# Patient Record
Sex: Female | Born: 2000 | Race: White | Hispanic: No | Marital: Single | Smoking: Never smoker
Health system: Southern US, Community
[De-identification: ages and names within clinical notes are randomized; demographics above are authoritative.]

## PROBLEM LIST (undated history)

## (undated) DIAGNOSIS — IMO0002 Reserved for concepts with insufficient information to code with codable children: Secondary | ICD-10-CM

## (undated) DIAGNOSIS — M549 Dorsalgia, unspecified: Secondary | ICD-10-CM

## (undated) DIAGNOSIS — F32A Depression, unspecified: Secondary | ICD-10-CM

## (undated) DIAGNOSIS — F419 Anxiety disorder, unspecified: Secondary | ICD-10-CM

## (undated) DIAGNOSIS — E739 Lactose intolerance, unspecified: Secondary | ICD-10-CM

## (undated) DIAGNOSIS — Z91018 Allergy to other foods: Secondary | ICD-10-CM

## (undated) DIAGNOSIS — J302 Other seasonal allergic rhinitis: Secondary | ICD-10-CM

## (undated) HISTORY — DX: Dorsalgia, unspecified: M54.9

## (undated) HISTORY — DX: Depression, unspecified: F32.A

## (undated) HISTORY — DX: Anxiety disorder, unspecified: F41.9

## (undated) HISTORY — DX: Lactose intolerance, unspecified: E73.9

## (undated) HISTORY — DX: Other seasonal allergic rhinitis: J30.2

## (undated) HISTORY — DX: Allergy to other foods: Z91.018

## (undated) HISTORY — DX: Reserved for concepts with insufficient information to code with codable children: IMO0002

---
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
## 2018-11-02 ENCOUNTER — Ambulatory Visit: Payer: PRIVATE HEALTH INSURANCE | Admitting: Family Medicine

## 2018-11-02 ENCOUNTER — Encounter: Payer: Self-pay | Admitting: Family Medicine

## 2018-11-02 VITALS — BP 102/64 | HR 115 | Temp 99.2°F | Ht 71.5 in | Wt 233.5 lb

## 2018-11-02 DIAGNOSIS — R509 Fever, unspecified: Secondary | ICD-10-CM | POA: Diagnosis not present

## 2018-11-02 DIAGNOSIS — J111 Influenza due to unidentified influenza virus with other respiratory manifestations: Secondary | ICD-10-CM | POA: Diagnosis not present

## 2018-11-02 LAB — POC INFLUENZA A&B (BINAX/QUICKVUE)
INFLUENZA A, POC: NEGATIVE
INFLUENZA B, POC: POSITIVE — AB

## 2018-11-02 MED ORDER — BENZONATATE 200 MG PO CAPS: 200.0000 mg | ORAL_CAPSULE | Freq: Three times a day (TID) | ORAL | 1 refills | Status: DC | PRN

## 2018-11-02 MED ORDER — OSELTAMIVIR PHOSPHATE 75 MG PO CAPS: 75.0000 mg | ORAL_CAPSULE | Freq: Two times a day (BID) | ORAL | 0 refills | Status: DC

## 2018-11-02 MED ORDER — ALBUTEROL SULFATE HFA 108 (90 BASE) MCG/ACT IN AERS: 1.0000 | INHALATION_SPRAY | Freq: Four times a day (QID) | RESPIRATORY_TRACT | 0 refills | Status: DC | PRN

## 2018-11-02 MED ORDER — FLUTICASONE PROPIONATE 50 MCG/ACT NA SUSP: 1.0000 | Freq: Every day | NASAL | 1 refills | Status: DC

## 2018-11-02 NOTE — Progress Notes (Signed)
Sx started about 4-5 days ago.  Started with cough.  Then sneezing, sweating, and more cough.  No sputum.  No aches. Some fevers, up to 101.7.  Fevers started about 2 days ago.  No vomiting, one episode of diarrhea.  No rash.  No ear pain.   Meds, vitals, and allergies reviewed.   ROS: Per HPI unless specifically indicated in ROS section   GEN: nad, alert and oriented HEENT: mucous membranes moist, tm w/o erythema, nasal exam w/o erythema, clear discharge noted,  OP with cobblestoning NECK: supple w/o LA CV: rrr.   PULM: ctab, no inc wob EXT: no edema SKIN: well perfused.    Flu positive, d/w pt at OV.

## 2018-11-02 NOTE — Patient Instructions (Signed)
Out of work for now.  Start tamiflu tonight.   Use tessalon pills for the cough.  If not better, use albuterol inhaler.  Rest and fluids.   Use flonase for nasal congestion.  Take care.  Glad to see you.  Update [place] as needed.

## 2018-11-03 DIAGNOSIS — J111 Influenza due to unidentified influenza virus with other respiratory manifestations: Secondary | ICD-10-CM | POA: Insufficient documentation

## 2018-11-03 NOTE — Assessment & Plan Note (Signed)
Nontoxic, okay for outpatient f/u.  Out of work for now.  Start tamiflu tonight.   Use tessalon pills for the cough.  If not better, use albuterol inhaler.  Rest and fluids.  Use flonase for nasal congestion.  Update [place] as needed.  She agrees.

## 2018-12-30 ENCOUNTER — Other Ambulatory Visit: Payer: Self-pay

## 2018-12-30 ENCOUNTER — Encounter: Payer: Self-pay | Admitting: Internal Medicine

## 2018-12-30 ENCOUNTER — Ambulatory Visit: Payer: PRIVATE HEALTH INSURANCE | Admitting: Internal Medicine

## 2018-12-30 VITALS — BP 108/76 | HR 87 | Temp 98.0°F | Wt 241.0 lb

## 2018-12-30 DIAGNOSIS — J029 Acute pharyngitis, unspecified: Secondary | ICD-10-CM

## 2018-12-30 DIAGNOSIS — Z20828 Contact with and (suspected) exposure to other viral communicable diseases: Secondary | ICD-10-CM

## 2018-12-30 LAB — POCT RAPID STREP A (OFFICE): Rapid Strep A Screen: NEGATIVE

## 2018-12-30 LAB — POC INFLUENZA A&B (BINAX/QUICKVUE)
INFLUENZA A, POC: NEGATIVE
Influenza B, POC: NEGATIVE

## 2018-12-30 MED ORDER — METHYLPREDNISOLONE ACETATE 80 MG/ML IJ SUSP
80.0000 mg | Freq: Once | INTRAMUSCULAR | Status: AC
  Administered 2018-12-30: 80 mg via INTRAMUSCULAR

## 2018-12-30 MED ORDER — OSELTAMIVIR PHOSPHATE 75 MG PO CAPS: 75.0000 mg | ORAL_CAPSULE | Freq: Every day | ORAL | 0 refills | Status: DC

## 2018-12-30 NOTE — Progress Notes (Signed)
HPI  Pt presents to the clinic today with c/o headache, sore throat and cough. This started yesterday. She describes the headache as a generalized ache. She denies difficulty swallowing. The cough is non productive. She denies runny nose, nasal congestion or ear pain. She denies fever, chills or body aches. She has tried Claritin, Flonase and Sudafed with minimal relief. She reports her father tested positive for the flu this week. She has no history of allergies.  Review of Systems      Past Medical History:  Diagnosis Date  . Term infant    NSVD, no complications    Family History  Problem Relation Age of Onset  . Asthma Sister   . Asthma Sister   . Cancer Maternal Grandmother        ?  . Diabetes Maternal Grandfather   . Hypertension Maternal Grandfather   . Heart disease Paternal Grandmother        heart surgery  . Diabetes Paternal Grandmother   . Diabetes Paternal Grandfather   . Heart disease Paternal Grandfather        CAD    Social History   Socioeconomic History  . Marital status: Single    Spouse name: Not on file  . Number of children: Not on file  . Years of education: Not on file  . Highest education level: Not on file  Occupational History  . Not on file  Social Needs  . Financial resource strain: Not on file  . Food insecurity:    Worry: Not on file    Inability: Not on file  . Transportation needs:    Medical: Not on file    Non-medical: Not on file  Tobacco Use  . Smoking status: Never Smoker  . Smokeless tobacco: Never Used  Substance and Sexual Activity  . Alcohol use: No    Alcohol/week: 0.0 standard drinks  . Drug use: No  . Sexual activity: Not on file  Lifestyle  . Physical activity:    Days per week: Not on file    Minutes per session: Not on file  . Stress: Not on file  Relationships  . Social connections:    Talks on phone: Not on file    Gets together: Not on file    Attends religious service: Not on file    Active member  of club or organization: Not on file    Attends meetings of clubs or organizations: Not on file    Relationship status: Not on file  . Intimate partner violence:    Fear of current or ex partner: Not on file    Emotionally abused: Not on file    Physically abused: Not on file    Forced sexual activity: Not on file  Other Topics Concern  . Not on file  Social History Narrative   Mom, dad and sister, dog   Plans on being vet   Watches TV: 4 hours a day   [employer], pool    Allergies  Allergen Reactions  . Amoxicillin-Pot Clavulanate     Vomting. Tolerates amoxicillin     Constitutional: Positive headache. Denies fatigue, fever or abrupt weight changes.  HEENT:  Positive sore throat. Denies eye redness, eye pain, pressure behind the eyes, facial pain, nasal congestion, ear pain, ringing in the ears, wax buildup, runny nose or bloody nose. Respiratory: Positive cough. Denies difficulty breathing or shortness of breath.  Cardiovascular: Denies chest pain, chest tightness, palpitations or swelling in the hands  or feet.   No other specific complaints in a complete review of systems (except as listed in HPI above).  Objective:   BP 108/76   Pulse 87   Temp 98 F (36.7 C) (Oral)   Wt 241 lb (109.3 kg)   SpO2 98%   BMI 33.14 kg/m   Wt Readings from Last 3 Encounters:  11/02/18 233 lb 8 oz (105.9 kg) (>99 %, Z= 2.35)*  12/01/17 221 lb 4 oz (100.4 kg) (99 %, Z= 2.21)*  05/27/17 198 lb (89.8 kg) (97 %, Z= 1.95)*   * Growth percentiles are based on CDC (Girls, 2-20 Years) data.     General: Appears her stated age, obese, in NAD. HEENT: Head: normal shape and size, no sinus tenderness noted;  Ears: Tm's gray and intact, normal light reflex; Nose: mucosa pink and moist, septum midline; Throat/Mouth: + PND. Teeth present, mucosa erythematous and moist, tonsils 3+ ,no exudate noted, no lesions or ulcerations noted.  Neck: Bilateral anterior cervical lymphadenopathy.   Cardiovascular: Normal rate and rhythm. S1,S2 noted.  No murmur, rubs or gallops noted.  Pulmonary/Chest: Normal effort and positive vesicular breath sounds. No respiratory distress. No wheezes, rales or ronchi noted.       Assessment & Plan:  Sore Throat, Exposure to Flu:  RST: negative Rapid flu: negatvie Get some rest and drink plenty of water Do salt water gargles for the sore throat 80 mg Depo IM now RX for Tamiflu for prevention  RTC as needed or if symptoms persist.   Nicki Reaper, NP

## 2018-12-30 NOTE — Patient Instructions (Signed)
Sore Throat  When you have a sore throat, your throat may feel:  · Tender.  · Burning.  · Irritated.  · Scratchy.  · Painful when you swallow.  · Painful when you talk.  Many things can cause a sore throat, such as:  · An infection.  · Allergies.  · Dry air.  · Smoke or pollution.  · Radiation treatment.  · Gastroesophageal reflux disease (GERD).  · A tumor.  A sore throat can be the first sign of another sickness. It can happen with other problems, like:  · Coughing.  · Sneezing.  · Fever.  · Swelling in the neck.  Most sore throats go away without treatment.  Follow these instructions at home:         · Take over-the-counter medicines only as told by your doctor.  ? If your child has a sore throat, do not give your child aspirin.  · Drink enough fluids to keep your pee (urine) pale yellow.  · Rest when you feel you need to.  · To help with pain:  ? Sip warm liquids, such as broth, herbal tea, or warm water.  ? Eat or drink cold or frozen liquids, such as frozen ice pops.  ? Gargle with a salt-water mixture 3-4 times a day or as needed. To make a salt-water mixture, add ½-1 tsp (3-6 g) of salt to 1 cup (237 mL) of warm water. Mix it until you cannot see the salt anymore.  ? Suck on hard candy or throat lozenges.  ? Put a cool-mist humidifier in your bedroom at night.  ? Sit in the bathroom with the door closed for 5-10 minutes while you run hot water in the shower.  · Do not use any products that contain nicotine or tobacco, such as cigarettes, e-cigarettes, and chewing tobacco. If you need help quitting, ask your doctor.  · Wash your hands well and often with soap and water. If soap and water are not available, use hand sanitizer.  Contact a doctor if:  · You have a fever for more than 2-3 days.  · You keep having symptoms for more than 2-3 days.  · Your throat does not get better in 7 days.  · You have a fever and your symptoms suddenly get worse.  · Your child who is 3 months to 3 years old has a temperature of  102.2°F (39°C) or higher.  Get help right away if:  · You have trouble breathing.  · You cannot swallow fluids, soft foods, or your saliva.  · You have swelling in your throat or neck that gets worse.  · You keep feeling sick to your stomach (nauseous).  · You keep throwing up (vomiting).  Summary  · A sore throat is pain, burning, irritation, or scratchiness in the throat. Many things can cause a sore throat.  · Take over-the-counter medicines only as told by your doctor. Do not give your child aspirin.  · Drink plenty of fluids, and rest as needed.  · Contact a doctor if your symptoms get worse or your sore throat does not get better within 7 days.  This information is not intended to replace advice given to you by your health care provider. Make sure you discuss any questions you have with your health care provider.  Document Released: 07/16/2008 Document Revised: 03/09/2018 Document Reviewed: 03/09/2018  Elsevier Interactive Patient Education © 2019 Elsevier Inc.

## 2019-03-18 ENCOUNTER — Encounter: Payer: Self-pay | Admitting: Family Medicine

## 2019-03-18 ENCOUNTER — Ambulatory Visit: Payer: PRIVATE HEALTH INSURANCE | Admitting: Family Medicine

## 2019-03-18 ENCOUNTER — Other Ambulatory Visit: Payer: Self-pay

## 2019-03-18 DIAGNOSIS — L253 Unspecified contact dermatitis due to other chemical products: Secondary | ICD-10-CM | POA: Diagnosis not present

## 2019-03-18 DIAGNOSIS — F411 Generalized anxiety disorder: Secondary | ICD-10-CM

## 2019-03-18 DIAGNOSIS — F320 Major depressive disorder, single episode, mild: Secondary | ICD-10-CM | POA: Diagnosis not present

## 2019-03-18 DIAGNOSIS — F5104 Psychophysiologic insomnia: Secondary | ICD-10-CM | POA: Diagnosis not present

## 2019-03-18 MED ORDER — PREDNISONE 20 MG PO TABS: ORAL_TABLET | ORAL | 0 refills | Status: DC

## 2019-03-18 MED ORDER — TRAZODONE HCL 50 MG PO TABS: 25.0000 mg | ORAL_TABLET | Freq: Every evening | ORAL | 3 refills | Status: DC | PRN

## 2019-03-18 NOTE — Assessment & Plan Note (Signed)
Treat with oral prednisone taper and antihistamines.  No red flags for systemic allergic reaction. No S/S of  infection.

## 2019-03-18 NOTE — Patient Instructions (Signed)
Start trazodone at bedtime for sleep. Start  Pred taper for facial swelling.   How to help anxiety - without medication.   1) Regular Exercise - walking, jogging, cycling, dancing, strength training  2)  Begin a Mindfulness/Meditation practice -- this can take a little as 3 minutes and is helpful for all kinds of mood issues -- You can find resources in books -- Or you can download apps like  ---- Headspace App (which currently has free content called "Weathering the Storm") ---- Calm (which has a few free options)  ---- Insignt Timer ---- Stop, Breathe & Think  # With each of these Apps - you should decline the "start free trial" offer and as you search through the App should be able to access some of their free content. You can also chose to pay for the content if you find one that works well for you.   # Many of them also offer sleep specific content which may help with insomnia  3) Healthy Diet -- Avoid or decrease Caffeine -- Avoid or decrease Alcohol -- Drink plenty of water, have a balanced diet -- Avoid cigarettes and marijuana (as well as other recreational drugs)  4) Consider contacting a professional therapist

## 2019-03-18 NOTE — Progress Notes (Signed)
Chief Complaint  Patient presents with  . Facial Swelling    Eye-after wearing false eyelashes on Wednesday    History of Present Illness: HPI  18 year old female presents with mom with acute onset swelling of face.   She reports new onset redness, itching and swelling... started after gluing on false eyelashes.  Symptoms began several hours after placing the lashes  2 days ago.   She took benadryl, tylenol, cold compress, cleaned off makeup.  No SOB, no lip or tounge swelling. No difficulty breathing. Decreased appetite. No fever. In AMs some white crusties and dried discharge.  Does now have a rash on chin and upper cheeks... stated in last 18 hours. She has gradually had less swelling over.  She has been having trouble with sleeping. In last 2 months. Has tried melatonin. Sleeps 3-8 hours a  Night.  Some increase anxiety and worry about covid and starting school. INcreased stress.  Strong family history  Of depression, both parents and only sister  GAD 7 : Generalized Anxiety Score 03/18/2019  Nervous, Anxious, on Edge 2  Control/stop worrying 1  Worry too much - different things 2  Trouble relaxing 3  Restless 2  Easily annoyed or irritable 3  Afraid - awful might happen 1  Total GAD 7 Score 14  Anxiety Difficulty Somewhat difficult    Depression screen PHQ 2/9 03/18/2019  Decreased Interest 1  Down, Depressed, Hopeless 2  PHQ - 2 Score 3  Altered sleeping 3  Tired, decreased energy 3  Change in appetite 2  Feeling bad or failure about yourself  2  Trouble concentrating 1  Moving slowly or fidgety/restless 1  Suicidal thoughts 0  PHQ-9 Score 15  Difficult doing work/chores Somewhat difficult    COVID 19 screen No recent travel or known exposure to COVID19 The patient denies respiratory symptoms of COVID 19 at this time.  The importance of social distancing was discussed today.   Review of Systems  Constitutional: Negative for chills and fever.  HENT:  Negative for congestion, ear pain, sinus pain and sore throat.   Eyes: Positive for discharge. Negative for blurred vision, double vision, photophobia and redness.  Respiratory: Negative for cough and shortness of breath.   Cardiovascular: Negative for chest pain and leg swelling.  Gastrointestinal: Negative for abdominal pain.  Genitourinary: Negative for dysuria.  Musculoskeletal: Negative for myalgias.  Skin: Positive for itching and rash.  Psychiatric/Behavioral: Positive for depression. Negative for hallucinations, memory loss, substance abuse and suicidal ideas. The patient is nervous/anxious. The patient does not have insomnia.       Past Medical History:  Diagnosis Date  . Term infant    NSVD, no complications    reports that she has never smoked. She has never used smokeless tobacco. She reports that she does not drink alcohol or use drugs.   Current Outpatient Medications:  .  fluticasone (FLONASE) 50 MCG/ACT nasal spray, Place 2 sprays into both nostrils daily., Disp: , Rfl:  .  Levonorgestrel (SKYLA) 13.5 MG IUD, Skyla 14 mcg/24 hrs (3 yrs) 13.5 mg intrauterine device  Take 1 device by intrauterine route., Disp: , Rfl:  .  loratadine (CLARITIN) 10 MG tablet, Take 1 tablet (10 mg total) by mouth daily., Disp: 90 tablet, Rfl: 3   Observations/Objective: Blood pressure 122/84, pulse 78, temperature 98.8 F (37.1 C), temperature source Oral, height 5' 11.5" (1.816 m), weight 246 lb 8 oz (111.8 kg), SpO2 98 %.  Physical Exam Constitutional:  General: She is not in acute distress.    Appearance: Normal appearance. She is well-developed. She is not ill-appearing or toxic-appearing.  HENT:     Head: Normocephalic.     Right Ear: Hearing, tympanic membrane, ear canal and external ear normal. Tympanic membrane is not erythematous, retracted or bulging.     Left Ear: Hearing, tympanic membrane, ear canal and external ear normal. Tympanic membrane is not erythematous, retracted  or bulging.     Nose: No mucosal edema or rhinorrhea.     Right Sinus: No maxillary sinus tenderness or frontal sinus tenderness.     Left Sinus: No maxillary sinus tenderness or frontal sinus tenderness.     Mouth/Throat:     Pharynx: Uvula midline.  Eyes:     General: Lids are normal. Lids are everted, no foreign bodies appreciated.     Conjunctiva/sclera: Conjunctivae normal.     Pupils: Pupils are equal, round, and reactive to light.  Neck:     Musculoskeletal: Normal range of motion and neck supple.     Thyroid: No thyroid mass or thyromegaly.     Vascular: No carotid bruit.     Trachea: Trachea normal.  Cardiovascular:     Rate and Rhythm: Normal rate and regular rhythm.     Pulses: Normal pulses.     Heart sounds: Normal heart sounds, S1 normal and S2 normal. No murmur. No friction rub. No gallop.   Pulmonary:     Effort: Pulmonary effort is normal. No tachypnea or respiratory distress.     Breath sounds: Normal breath sounds. No decreased breath sounds, wheezing, rhonchi or rales.  Abdominal:     General: Bowel sounds are normal.     Palpations: Abdomen is soft.     Tenderness: There is no abdominal tenderness.  Skin:    General: Skin is warm and dry.     Findings: No rash.     Comments: Erythema and swelling around both eyes, no current discharge or conjunctivitis  Neurological:     Mental Status: She is alert.  Psychiatric:        Attention and Perception: Attention normal.        Mood and Affect: Mood is depressed. Mood is not anxious. Affect is tearful.        Speech: Speech normal.        Behavior: Behavior normal. Behavior is cooperative.        Thought Content: Thought content normal. Thought content does not include homicidal or suicidal ideation. Thought content does not include homicidal or suicidal plan.        Cognition and Memory: Cognition normal.        Judgment: Judgment normal.      Assessment and Plan   Contact dermatitis due to chemicals Treat  with oral prednisone taper and antihistamines.  No red flags for systemic allergic reaction. No S/S of  infection.  GAD (generalized anxiety disorder) Discussed options.  Will start with stress reduction, relaxation and sleep treatment.  If not improving in 4 weeks at follow consider medications to treat. Scientist, research (medical)ffered counselor.. pt refused.   Chronic insomnia Reviewed healthy sleep hygeine. Start trazodone for sleep at  night.    Kerby NoraAmy Bedsole, MD

## 2019-03-18 NOTE — Assessment & Plan Note (Signed)
Reviewed healthy sleep hygeine. Start trazodone for sleep at  night.

## 2019-03-18 NOTE — Assessment & Plan Note (Signed)
Discussed options.  Will start with stress reduction, relaxation and sleep treatment.  If not improving in 4 weeks at follow consider medications to treat. Scientist, research (medical).. pt refused.

## 2019-04-09 ENCOUNTER — Encounter: Payer: Self-pay | Admitting: Family Medicine

## 2019-04-09 ENCOUNTER — Ambulatory Visit (INDEPENDENT_AMBULATORY_CARE_PROVIDER_SITE_OTHER): Payer: PRIVATE HEALTH INSURANCE | Admitting: Family Medicine

## 2019-04-09 VITALS — BP 110/83 | HR 74 | Temp 98.2°F | Ht 71.5 in | Wt 245.5 lb

## 2019-04-09 DIAGNOSIS — F5104 Psychophysiologic insomnia: Secondary | ICD-10-CM

## 2019-04-09 DIAGNOSIS — F320 Major depressive disorder, single episode, mild: Secondary | ICD-10-CM

## 2019-04-09 DIAGNOSIS — F411 Generalized anxiety disorder: Secondary | ICD-10-CM

## 2019-04-09 MED ORDER — VENLAFAXINE HCL ER 37.5 MG PO CP24: 37.5000 mg | ORAL_CAPSULE | Freq: Every day | ORAL | 3 refills | Status: DC

## 2019-04-09 NOTE — Assessment & Plan Note (Signed)
She will set up counselor through her Mom's work. Start venlafaxine daily, titrate dose. Close follow up in 2 week given fear and anxiety about school start.

## 2019-04-09 NOTE — Progress Notes (Signed)
VIRTUAL VISIT Due to national recommendations of social distancing due to COVID 19, a virtual visit is felt to be most appropriate for this patient at this time.   I connected with the patient on 04/09/19 at 10:20 AM EDT by virtual telehealth platform and verified that I am speaking with the correct person using two identifiers.   I discussed the limitations, risks, security and privacy concerns of performing an evaluation and management service by  virtual telehealth platform and the availability of in person appointments. I also discussed with the patient that there may be a patient responsible charge related to this service. The patient expressed understanding and agreed to proceed.  Patient location: Home Provider [place] [place] Participants: Kerby NoraAmy Eevie Lapp and Vito BergerKristine Carol   Chief Complaint  Patient presents with  . Follow-up    Mood    History of Present Illness: 18 year old female patient presents for follow up on mood.  GAD and  Chronic insomnia: Started at last OV on trazodone. Recommended stress reduction and relaxation. She has tried to meditate at night.  Trazodone makes her sleep issues worse. Cannot fall asleep for 2 hours.   She is more anxious lately given school is about to start 04/26/2019. She has increased stress with this.  She is more irritable latey. No panic attack.    Dep[facility] screen [facility] 04/09/2019 03/18/2019  Decreased Interest 1 1  Down, Depressed, Hopeless 0 2  PHQ - 2 Score 1 3  Altered sleeping 3 3  Tired, decreased energy 2 3  Change in appetite 2 2  Feeling bad or failure about yourself  1 2  Trouble concentrating 0 1  Moving slowly or fidgety/restless 0 1  Suicidal thoughts 0 0  PHQ-9 Score 9 15  Difficult doing work/chores Somewhat difficult Somewhat difficult   GAD 7 : Generalized Anxiety Score 04/09/2019 03/18/2019  Nervous, Anxious, on Edge 3 2  Control/stop worrying 1 1  Worry too much - different things 3 2   Trouble relaxing 3 3  Restless 1 2  Easily annoyed or irritable 2 3  Afraid - awful might happen 0 1  Total GAD 7 Score 13 14  Anxiety Difficulty Somewhat difficult Somewhat difficult      COVID 19 screen No recent travel or known exposure to COVID19 The patient denies respiratory symptoms of COVID 19 at this time.  The importance of social distancing was discussed today.   Review of Systems  Constitutional: Negative for chills and fever.  HENT: Negative for congestion and ear pain.   Eyes: Negative for pain and redness.  Respiratory: Negative for cough and shortness of breath.   Cardiovascular: Negative for chest pain, palpitations and leg swelling.  Gastrointestinal: Negative for abdominal pain, blood in stool, constipation, diarrhea, nausea and vomiting.  Genitourinary: Negative for dysuria.  Musculoskeletal: Negative for falls and myalgias.  Skin: Negative for rash.  Neurological: Negative for dizziness.  Psychiatric/Behavioral: Positive for depression. Negative for hallucinations, memory loss, substance abuse and suicidal ideas. The patient is nervous/anxious and has insomnia.       Past Medical History:  Diagnosis Date  . Term infant    NSVD, no complications    reports that she has never smoked. She has never used smokeless tobacco. She reports that she does not drink alcohol or use drugs.   Current Outpatient Medications:  .  fluticasone (FLONASE) 50 MCG/ACT nasal spray, Place 2 sprays into both nostrils daily., Disp: , Rfl:  .  Levonorgestrel (  SKYLA) 13.5 MG IUD, Skyla 14 mcg/24 hrs (3 yrs) 13.5 mg intrauterine device  Take 1 device by intrauterine route., Disp: , Rfl:  .  loratadine (CLARITIN) 10 MG tablet, Take 1 tablet (10 mg total) by mouth daily., Disp: 90 tablet, Rfl: 3 .  traZODone (DESYREL) 50 MG tablet, Take 0.5-1 tablets (25-50 mg total) by mouth at bedtime as needed for sleep., Disp: 30 tablet, Rfl: 3   Observations/Objective: Blood pressure 110/83,  pulse 74, temperature 98.2 F (36.8 C), temperature source Oral, height 5' 11.5" (1.816 m), weight 245 lb 8 oz (111.4 kg).  Physical Exam  Physical Exam Constitutional:      General: The patient is not in acute distress. Pulmonary:     Effort: Pulmonary effort is normal. No respiratory distress.  Neurological:     Mental Status: The patient is alert and oriented to person, place, and time.  Psychiatric:        Mood and Affect: Mood depressed, flat affect/tearful affect   Behavior: Behavior normal. Insight moderate Denies SI, HI  Assessment and Plan   Depression, major, single episode, mild ([place]) She will set up counselor through her Mom's work. Start venlafaxine daily, titrate dose. Close follow up in 2 week given fear and anxiety about school start.  Chronic insomnia Trazodone made her have more issues sleeping. Can sue melatonin for sleep.  Start SNRI as planned.   I discussed the assessment and treatment plan with the patient. The patient was provided an opportunity to ask questions and all were answered. The patient agreed with the plan and demonstrated an understanding of the instructions.   The patient was advised to call back or seek an in-person evaluation if the symptoms worsen or if the condition fails to improve as anticipated.     Eliezer Lofts, MD

## 2019-04-09 NOTE — Assessment & Plan Note (Signed)
Trazodone made her have more issues sleeping. Can sue melatonin for sleep.  Start SNRI as planned.

## 2019-04-13 NOTE — Progress Notes (Signed)
Spoke with pt She doesn't have her school schedule yet  She stated she would call back to schedule

## 2019-07-05 ENCOUNTER — Other Ambulatory Visit: Payer: Self-pay | Admitting: *Deleted

## 2019-07-05 MED ORDER — VENLAFAXINE HCL ER 75 MG PO CP24: 75.0000 mg | ORAL_CAPSULE | Freq: Every day | ORAL | 0 refills | Status: DC

## 2019-07-12 ENCOUNTER — Other Ambulatory Visit: Payer: Self-pay

## 2019-07-12 ENCOUNTER — Encounter: Payer: Self-pay | Admitting: Family Medicine

## 2019-07-12 ENCOUNTER — Ambulatory Visit (INDEPENDENT_AMBULATORY_CARE_PROVIDER_SITE_OTHER): Payer: PRIVATE HEALTH INSURANCE | Admitting: Family Medicine

## 2019-07-12 VITALS — BP 123/78 | HR 109 | Temp 98.6°F | Ht 71.5 in

## 2019-07-12 DIAGNOSIS — R05 Cough: Secondary | ICD-10-CM | POA: Diagnosis not present

## 2019-07-12 DIAGNOSIS — R0981 Nasal congestion: Secondary | ICD-10-CM

## 2019-07-12 DIAGNOSIS — Z20822 Contact with and (suspected) exposure to covid-19: Secondary | ICD-10-CM

## 2019-07-12 DIAGNOSIS — R059 Cough, unspecified: Secondary | ICD-10-CM

## 2019-07-12 DIAGNOSIS — G44209 Tension-type headache, unspecified, not intractable: Secondary | ICD-10-CM | POA: Diagnosis not present

## 2019-07-12 NOTE — Progress Notes (Signed)
     Kristl Morioka T. Yancy Knoble, MD [facility] [place], [address] Phone: [phone]  FAX: [phone]  Misty [name] - 18 y.o. female  MRN [id number]  Date of Birth: [birth year]  Visit Date: 07/12/2019  PCP: Jinny Sanders, MD  Referred by: Jinny Sanders, MD Chief Complaint  Patient presents with  . Nasal Congestion  . Cough    clear phelgm  . Headache   Virtual Visit via Video Note:  I connected with  Misty [name] on 07/12/2019 11:00 AM EDT by a video enabled telemedicine application and verified that I am speaking with the correct person using two identifiers.   Location patient: home computer, tablet, or smartphone Location provider: work or home office Consent: Verbal consent directly obtained from [employer]. Persons participating in the virtual visit: patient, provider  I discussed the limitations of evaluation and management by telemedicine and the availability of in person appointments. The patient expressed understanding and agreed to proceed.  History of Present Illness:  Well-known patient, she was seen today.  She is having some nasal congestion, runny nose, and some cough with clear phlegm.  She also has a headache, but she does not have any fever.  She denies nausea, vomiting, diarrhea.  No new rashes.  No new neurological symptoms.  Review of Systems as above: See pertinent positives and pertinent negatives per HPI No acute distress verbally  Past Medical History, Surgical History, Social History, Family History, Problem List, Medications, and Allergies have been reviewed and updated if relevant.   Observations/Objective/Exam:  An attempt was made to discern vital signs over the phone and per patient if applicable and possible.   General:    Alert, Oriented, appears well and in no acute distress HEENT:     Atraumatic, conjunctiva clear, no obvious abnormalities on  inspection of external nose and ears.  Neck:    Normal movements of the head and neck Pulmonary:     On inspection no signs of respiratory distress, breathing rate appears normal, no obvious gross SOB, gasping or wheezing Cardiovascular:    No obvious cyanosis Musculoskeletal:    Moves all visible extremities without noticeable abnormality Psych / Neurological:     Pleasant and cooperative, no obvious depression or anxiety, speech and thought processing grossly intact  Assessment and Plan:    ICD-10-CM   1. Cough  R05   2. Acute non intractable tension-type headache  G44.209   3. Nasal congestion  R09.81    Likely viral syndrome, but given constellation of symptoms and COVID-19 outbreak, I will going to send her for COVID-19 testing and keep her quarantined out of school until this returns.  I discussed the assessment and treatment plan with the patient. The patient was provided an opportunity to ask questions and all were answered. The patient agreed with the plan and demonstrated an understanding of the instructions.   The patient was advised to call back or seek an in-person evaluation if the symptoms worsen or if the condition fails to improve as anticipated.  Follow-up: prn unless noted otherwise below No follow-ups on file.  No orders of the defined types were placed in this encounter.  No orders of the defined types were placed in this encounter.   Signed,  Maud Deed. Westlyn Glaza, MD

## 2019-07-14 ENCOUNTER — Telehealth: Payer: Self-pay | Admitting: General Practice

## 2019-07-14 LAB — NOVEL CORONAVIRUS, NAA: SARS-CoV-2, NAA: NOT DETECTED

## 2019-07-14 NOTE — Telephone Encounter (Signed)
Negative COVID results given. Patient results "NOT Detected." Caller expressed understanding. ° °

## 2020-01-27 ENCOUNTER — Encounter: Payer: Self-pay | Admitting: Family Medicine

## 2020-01-28 ENCOUNTER — Ambulatory Visit (INDEPENDENT_AMBULATORY_CARE_PROVIDER_SITE_OTHER)
Admission: RE | Admit: 2020-01-28 | Discharge: 2020-01-28 | Disposition: A | Discharge status: Home / Self Care | Payer: PRIVATE HEALTH INSURANCE | Source: Ambulatory Visit | Attending: Family Medicine | Admitting: Family Medicine

## 2020-01-28 ENCOUNTER — Encounter: Payer: Self-pay | Admitting: Family Medicine

## 2020-01-28 ENCOUNTER — Other Ambulatory Visit: Payer: Self-pay

## 2020-01-28 ENCOUNTER — Ambulatory Visit: Payer: PRIVATE HEALTH INSURANCE | Admitting: Family Medicine

## 2020-01-28 VITALS — BP 120/78 | HR 95 | Temp 97.9°F | Ht 71.5 in

## 2020-01-28 DIAGNOSIS — S99912A Unspecified injury of left ankle, initial encounter: Secondary | ICD-10-CM

## 2020-01-28 DIAGNOSIS — S99911A Unspecified injury of right ankle, initial encounter: Secondary | ICD-10-CM | POA: Insufficient documentation

## 2020-01-28 NOTE — Telephone Encounter (Signed)
Some left v/m earlier but since then appt already scheduled today at 2:45 with Dr Reece Agar.

## 2020-01-28 NOTE — Progress Notes (Addendum)
This visit was conducted in person.  BP 120/78 (BP Location: Right Arm, Patient Position: Sitting, Cuff Size: Large)   Pulse 95   Temp 97.9 F (36.6 C) (Temporal)   Ht 5' 11.5" (1.816 m)   SpO2 98%   BMI 33.76 kg/m    CC: R ankle injury Subjective:    Patient ID: Misty [name], female    DOB: 2001/07/25, 19 y.o.   MRN: [id number]  HPI: Misty [name] is a 19 y.o. female presenting on 01/28/2020 for Ankle Injury (Pt tripped and fell, last night, injuring right ankle. )   DOI: 01/27/2020 Tripped and fell last night while feeding lettuce to a rabbit, lost her balance. Twisted ankle, felt a pop. Pain lateral ankle and anterior ankle. Very swollen last night, keeping it elevated, iced, compression. Walking with crutches. Trouble bearing weight since then. Took tylenol 500-1000mg  last night with benefit.   Broke this ankle across growth plate at age 79WE.  LMP 1 yr ago - has [place] IUD     Relevant past medical, surgical, family and social history reviewed and updated as indicated. Interim medical history since our last visit reviewed. Allergies and medications reviewed and updated. Outpatient Medications Prior to Visit  Medication Sig Dispense Refill  . fluticasone (FLONASE) 50 MCG/ACT nasal spray Place 2 sprays into both nostrils daily.    . Levonorgestrel (SKYLA) 13.5 MG IUD Skyla 14 mcg/24 hrs (3 yrs) 13.5 mg intrauterine device  Take 1 device by intrauterine route.    . loratadine (CLARITIN) 10 MG tablet Take 1 tablet (10 mg total) by mouth daily. 90 tablet 3  . traZODone (DESYREL) 50 MG tablet Take 0.5-1 tablets (25-50 mg total) by mouth at bedtime as needed for sleep. 30 tablet 3  . venlafaxine XR (EFFEXOR XR) 75 MG 24 hr capsule Take 1 capsule (75 mg total) by mouth daily with breakfast. 90 capsule 0   No facility-administered medications prior to visit.     Per HPI unless specifically indicated in ROS section below Review of Systems Objective:    BP 120/78 (BP Location:  Right Arm, Patient Position: Sitting, Cuff Size: Large)   Pulse 95   Temp 97.9 F (36.6 C) (Temporal)   Ht 5' 11.5" (1.816 m)   SpO2 98%   BMI 33.76 kg/m   Wt Readings from Last 3 Encounters:  04/09/19 245 lb 8 oz (111.4 kg)  03/18/19 246 lb 8 oz (111.8 kg) (>99 %, Z= 2.49)*  12/30/18 241 lb (109.3 kg) (>99 %, Z= 2.43)*   * Growth percentiles are based on CDC (Girls, 2-20 Years) data.    Physical Exam Vitals and nursing note reviewed.  Constitutional:      Appearance: Normal appearance. She is not ill-appearing.     Comments: In tears with ankle exam due to pain  Musculoskeletal:        General: Swelling, tenderness and signs of injury present.     Right lower leg: No edema.     Left lower leg: No edema.     Comments:  2+ DP on right L ankle/foot WNL R ankle/foot: Tender to percussion medial and lateral malleolus, tender to palpation of deltoid ankle ligament, mild discomfort along lateral ankle ligament, more pain with testing laxity laterally No ligament laxity with testing medial or lateral ankle ligaments No pain at base of 5th MT No pain at navicular Neg calcaneal squeeze test  Skin:    General: Skin is warm and dry.     Findings: No  bruising or rash.  Neurological:     General: No focal deficit present.     Mental Status: She is alert.       Assessment & Plan:  This visit occurred during the SARS-CoV-2 public health emergency.  Safety protocols were in place, including screening questions prior to the visit, additional usage of staff PPE, and extensive cleaning of exam room while observing appropriate contact time as indicated for disinfecting solutions.   Problem List Items Addressed This Visit    Right ankle injury, initial encounter - Primary    Check xray given tender to malleoli - no obvious fracture noted.  Anticipate bad ankle sprain - placed in ASO ankle brace, rec rest, elevation, ice, NSAIDs.  RTC 2 wk injury check, sooner if worsening.  Exercises for  ankle sprain rehab provided from [facility] pt advisor.       Relevant Orders   DG Ankle Complete Right       No orders of the defined types were placed in this encounter.  Orders Placed This Encounter  Procedures  . DG Ankle Complete Right    Standing Status:   Future    Number of Occurrences:   1    Standing Expiration Date:   03/29/2021    Order Specific Question:   Reason for Exam (SYMPTOM  OR DIAGNOSIS REQUIRED)    Answer:   R ankle injury after fall last night, h/o fracture    Order Specific Question:   Is patient pregnant?    Answer:   No    Order Specific Question:   Preferred imaging location?    Answer:   [facility]    Order Specific Question:   Radiology Contrast Protocol - do NOT remove file path    Answer:   [email or web address]    Patient Instructions  Xrays don't show obvious fracture - will await radiologist read.  I think you had bad ankle sprain.  ASO brace - use for the next 4 weeks.  Do exercises provided today for ankle sprain, use pain as your guide.  Continue crutches for the next 2 weeks.  Use ibuprofen 400-600mg  twice daily with meals for inflammation. May also use tylenol 500-1000mg  twice daily.  Return in 2 weeks or sooner if not improving as expected over the next several weeks.    Follow up plan: Return in about 2 weeks (around 02/11/2020).  Ria Bush, MD

## 2020-01-28 NOTE — Patient Instructions (Addendum)
Xrays don't show obvious fracture - will await radiologist read.  I think you had bad ankle sprain.  ASO brace - use for the next 4 weeks.  Do exercises provided today for ankle sprain, use pain as your guide.  Continue crutches for the next 2 weeks.  Use ibuprofen 400-600mg  twice daily with meals for inflammation. May also use tylenol 500-1000mg  twice daily.  Return in 2 weeks or sooner if not improving as expected over the next several weeks.

## 2020-01-28 NOTE — Assessment & Plan Note (Addendum)
Check xray given tender to malleoli - no obvious fracture noted.  Anticipate bad ankle sprain - placed in ASO ankle brace, rec rest, elevation, ice, NSAIDs.  RTC 2 wk injury check, sooner if worsening.  Exercises for ankle sprain rehab provided from [facility] pt advisor.

## 2020-02-11 ENCOUNTER — Ambulatory Visit: Payer: PRIVATE HEALTH INSURANCE | Admitting: Family Medicine

## 2020-02-11 ENCOUNTER — Encounter: Payer: Self-pay | Admitting: Family Medicine

## 2020-02-11 ENCOUNTER — Other Ambulatory Visit: Payer: Self-pay

## 2020-02-11 VITALS — BP 120/86 | HR 99 | Temp 98.2°F | Ht 71.5 in | Wt 258.2 lb

## 2020-02-11 DIAGNOSIS — S99911A Unspecified injury of right ankle, initial encounter: Secondary | ICD-10-CM

## 2020-02-11 NOTE — Patient Instructions (Signed)
How to Heal a Sprained Ankle: 5-Step Rehab Program ...[email or web address] > training > recovery Continue brace on ankle for likely 2 more weeks.. continue progressing through rehab.

## 2020-02-11 NOTE — Progress Notes (Signed)
Chief Complaint  Patient presents with  . Follow-up    Right Ankle Injury    History of Present Illness: HPI  19 year old female patient presents for follow up right ankle   PAST HPI copied from 01/28/2020 OV with Dr. Reece Agar: DOI: 01/27/2020 Tripped and fell last night while feeding lettuce to a rabbit, lost her balance. Twisted ankle, felt a pop. Pain lateral ankle and anterior ankle. Very swollen last night, keeping it elevated, iced, compression. Walking with crutches. Trouble bearing weight since then. Took tylenol 500-1000mg  last night with benefit.  Broke this ankle across growth plate at age 79KG.   X-ray: IMPRESSION: Soft tissue swelling.  No other abnormalities. Dx ankle sprain: treated with ASO ankle brace, rest, elevation, ice, NSAIDs  She is here today for 2 week follow up. Less swelling. She has noted improvement in ankle pain. Fels much better with brace in place.  At end of day it does hurt. She has been doing home PT 1-2 times daily.  This visit occurred during the SARS-CoV-2 public health emergency.  Safety protocols were in place, including screening questions prior to the visit, additional usage of staff PPE, and extensive cleaning of exam room while observing appropriate contact time as indicated for disinfecting solutions.   COVID 19 screen:  No recent travel or known exposure to COVID19 The patient denies respiratory symptoms of COVID 19 at this time. The importance of social distancing was discussed today.     Review of Systems  Constitutional: Negative for chills and fever.  HENT: Negative for congestion and ear pain.   Eyes: Negative for pain and redness.  Respiratory: Negative for cough and shortness of breath.   Cardiovascular: Negative for chest pain, palpitations and leg swelling.  Gastrointestinal: Negative for abdominal pain, blood in stool, constipation, diarrhea, nausea and vomiting.  Genitourinary: Negative for dysuria.  Musculoskeletal: Negative  for falls and myalgias.  Skin: Negative for rash.  Neurological: Negative for dizziness.  Psychiatric/Behavioral: Negative for depression. The patient is not nervous/anxious.       Past Medical History:  Diagnosis Date  . Term infant    NSVD, no complications    reports that she has never smoked. She has never used smokeless tobacco. She reports that she does not drink alcohol or use drugs.   Current Outpatient Medications:  .  fluticasone (FLONASE) 50 MCG/ACT nasal spray, Place 2 sprays into both nostrils daily., Disp: , Rfl:  .  Levonorgestrel (SKYLA) 13.5 MG IUD, Skyla 14 mcg/24 hrs (3 yrs) 13.5 mg intrauterine device  Take 1 device by intrauterine route., Disp: , Rfl:  .  loratadine (CLARITIN) 10 MG tablet, Take 1 tablet (10 mg total) by mouth daily., Disp: 90 tablet, Rfl: 3 .  traZODone (DESYREL) 50 MG tablet, Take 0.5-1 tablets (25-50 mg total) by mouth at bedtime as needed for sleep., Disp: 30 tablet, Rfl: 3 .  venlafaxine XR (EFFEXOR XR) 75 MG 24 hr capsule, Take 1 capsule (75 mg total) by mouth daily with breakfast., Disp: 90 capsule, Rfl: 0   Observations/Objective: Blood pressure 120/86, pulse 99, temperature 98.2 F (36.8 C), temperature source Temporal, height 5' 11.5" (1.816 m), weight 258 lb 4 oz (117.1 kg), SpO2 95 %.  Physical Exam Constitutional:      General: She is not in acute distress.    Appearance: Normal appearance. She is well-developed. She is not ill-appearing or toxic-appearing.  HENT:     Head: Normocephalic.     Right Ear: Hearing, tympanic  membrane, ear canal and external ear normal. Tympanic membrane is not erythematous, retracted or bulging.     Left Ear: Hearing, tympanic membrane, ear canal and external ear normal. Tympanic membrane is not erythematous, retracted or bulging.     Nose: No mucosal edema or rhinorrhea.     Right Sinus: No maxillary sinus tenderness or frontal sinus tenderness.     Left Sinus: No maxillary sinus tenderness or frontal  sinus tenderness.     Mouth/Throat:     Pharynx: Uvula midline.  Eyes:     General: Lids are normal. Lids are everted, no foreign bodies appreciated.     Conjunctiva/sclera: Conjunctivae normal.     Pupils: Pupils are equal, round, and reactive to light.  Neck:     Thyroid: No thyroid mass or thyromegaly.     Vascular: No carotid bruit.     Trachea: Trachea normal.  Cardiovascular:     Rate and Rhythm: Normal rate and regular rhythm.     Pulses: Normal pulses.     Heart sounds: Normal heart sounds, S1 normal and S2 normal. No murmur. No friction rub. No gallop.   Pulmonary:     Effort: Pulmonary effort is normal. No tachypnea or respiratory distress.     Breath sounds: Normal breath sounds. No decreased breath sounds, wheezing, rhonchi or rales.  Abdominal:     General: Bowel sounds are normal.     Palpations: Abdomen is soft.     Tenderness: There is no abdominal tenderness.  Musculoskeletal:     Cervical back: Normal range of motion and neck supple.       Feet:  Feet:     Comments:  Full ROM, nml sensation and strength  diffuse nonpoitting edema of right ankle compared to left  ttp over anterior lateral malleolus.. no other areas of ttp. Skin:    General: Skin is warm and dry.     Findings: No rash.  Neurological:     Mental Status: She is alert.  Psychiatric:        Mood and Affect: Mood is not anxious or depressed.        Speech: Speech normal.        Behavior: Behavior normal. Behavior is cooperative.        Thought Content: Thought content normal.        Judgment: Judgment normal.      Assessment and Plan Right ankle injury, initial encounter Move stepwise thorough rehab.. proceed to strengthening regimen.  Continue brace when up on feet.  Elevated and NSAIDs prn.    Eliezer Lofts, MD

## 2020-02-11 NOTE — Assessment & Plan Note (Signed)
Move stepwise thorough rehab.. proceed to strengthening regimen.  Continue brace when up on feet.  Elevated and NSAIDs prn.

## 2020-09-12 ENCOUNTER — Encounter: Payer: Self-pay | Admitting: Family Medicine

## 2020-09-12 ENCOUNTER — Ambulatory Visit: Payer: PRIVATE HEALTH INSURANCE | Admitting: Family Medicine

## 2020-09-12 ENCOUNTER — Other Ambulatory Visit: Payer: Self-pay

## 2020-09-12 VITALS — BP 102/60 | HR 100 | Temp 97.8°F | Ht 71.5 in | Wt 257.8 lb

## 2020-09-12 DIAGNOSIS — J189 Pneumonia, unspecified organism: Secondary | ICD-10-CM | POA: Diagnosis not present

## 2020-09-12 MED ORDER — AZITHROMYCIN 250 MG PO TABS: ORAL_TABLET | ORAL | 0 refills | Status: AC

## 2020-09-12 NOTE — Progress Notes (Signed)
Kaiya Boatman T. Mervin Ramires, MD, CAQ Sports Medicine  Primary Care and Sports Medicine [facility] [address] [place], [address]  Phone: [phone]  FAX: [phone]  Misty [name] - 19 y.o. female  MRN [id number]  Date of Birth: 2000-12-04  Date: 09/12/2020  PCP: Excell Seltzer, MD  Referral: Excell Seltzer, MD  Chief Complaint  Patient presents with  . Cough    Negative Covidn test a few weeks ago  . Nasal Congestion    This visit occurred during the SARS-CoV-2 public health emergency.  Safety protocols were in place, including screening questions prior to the visit, additional usage of staff PPE, and extensive cleaning of exam room while observing appropriate contact time as indicated for disinfecting solutions.   Subjective:   Misty [name] is a 19 y.o. very pleasant female patient with Body mass index is 35.45 kg/m. who presents with the following:  She presents after being sick for several weeks, and she has had multiple negative Covid test.  She is congested and coughing daily at this point, she does continue to have some congestion in the upper airways as well.  She is coughing at night, and over-the-counter remedies are not really helping all that much.  Father did have COVID-19 approximately 1 month ago.  She has a some earache, and there are some muffled sensations on her sound.  Throughout this time she has not had any fever.  She is globally a pretty healthy young lady.  Stuffy congestion:  Review of Systems is noted in the HPI, as appropriate  Objective:   BP 102/60   Pulse 100   Temp 97.8 F (36.6 C) (Temporal)   Ht 5' 11.5" (1.816 m)   Wt 257 lb 12 oz (116.9 kg)   SpO2 98%   BMI 35.45 kg/m   GEN: No acute distress; alert,appropriate. PULM: Breathing comfortably in no respiratory distress PSYCH: Normally interactive.  CV: RRR, no m/g/r  PULM: Normal respiratory rate, no accessory muscle use. No wheezes,  crackles.  There are some scattered rhonchi bilateral bases  Laboratory and Imaging Data:  Assessment and Plan:     ICD-10-CM   1. Atypical pneumonia  J18.9    With greater than 3 weeks duration and multiple negative Covid test, the highest causes on the differential would be atypical pneumonia.  Pertussis could certainly show the same thing.  Treat with Z-Pak, and additionally use continued over-the-counter cough and cold remedies.  Meds ordered this encounter  Medications  . azithromycin (ZITHROMAX) 250 MG tablet    Sig: Take 2 tablets (500 mg total) by mouth daily for 1 day, THEN 1 tablet (250 mg total) daily for 4 days.    Dispense:  6 tablet    Refill:  0   Medications Discontinued During This Encounter  Medication Reason  . loratadine (CLARITIN) 10 MG tablet Change in therapy   No orders of the defined types were placed in this encounter.   Follow-up: No follow-ups on file.  Signed,  Elpidio Galea. Rivka Baune, MD   Outpatient Encounter Medications as of 09/12/2020  Medication Sig  . cetirizine (ZYRTEC) 10 MG tablet Take 10 mg by mouth daily.  . fluticasone (FLONASE) 50 MCG/ACT nasal spray Place 2 sprays into both nostrils daily.  . Levonorgestrel (SKYLA) 13.5 MG IUD Skyla 14 mcg/24 hrs (3 yrs) 13.5 mg intrauterine device  Take 1 device by intrauterine route.  . traZODone (DESYREL) 50 MG tablet Take 0.5-1 tablets (25-50  mg total) by mouth at bedtime as needed for sleep.  Marland Kitchen azithromycin (ZITHROMAX) 250 MG tablet Take 2 tablets (500 mg total) by mouth daily for 1 day, THEN 1 tablet (250 mg total) daily for 4 days.  Marland Kitchen venlafaxine XR (EFFEXOR XR) 75 MG 24 hr capsule Take 1 capsule (75 mg total) by mouth daily with breakfast. (Patient not taking: Reported on 09/12/2020)  . [DISCONTINUED] loratadine (CLARITIN) 10 MG tablet Take 1 tablet (10 mg total) by mouth daily.   No facility-administered encounter medications on file as of 09/12/2020.

## 2020-10-03 LAB — OB RESULTS CONSOLE GC/CHLAMYDIA: Chlamydia: NEGATIVE

## 2020-10-03 LAB — HM PAP SMEAR: HM Pap smear: NEGATIVE

## 2020-10-23 DIAGNOSIS — R87612 Low grade squamous intraepithelial lesion on cytologic smear of cervix (LGSIL): Secondary | ICD-10-CM

## 2020-10-23 HISTORY — DX: Low grade squamous intraepithelial lesion on cytologic smear of cervix (LGSIL): R87.612

## 2021-02-27 ENCOUNTER — Ambulatory Visit: Payer: PRIVATE HEALTH INSURANCE | Admitting: Family Medicine

## 2021-02-27 ENCOUNTER — Other Ambulatory Visit: Payer: Self-pay

## 2021-02-27 ENCOUNTER — Encounter: Payer: Self-pay | Admitting: Family Medicine

## 2021-02-27 VITALS — BP 94/70 | HR 76 | Temp 98.2°F | Ht 71.5 in | Wt 255.0 lb

## 2021-02-27 DIAGNOSIS — R0981 Nasal congestion: Secondary | ICD-10-CM | POA: Diagnosis not present

## 2021-02-27 DIAGNOSIS — S46819A Strain of other muscles, fascia and tendons at shoulder and upper arm level, unspecified arm, initial encounter: Secondary | ICD-10-CM | POA: Diagnosis not present

## 2021-02-27 NOTE — Assessment & Plan Note (Signed)
Likely viral vs allergies.  Start nasal saline.. continue flonase, change claritin to Xyzal.  neg COVID test.  No sign of bacterial infection.

## 2021-02-27 NOTE — Patient Instructions (Signed)
Heat, ibupfrofen 800 mg  Three times daily with meals.  Start massage on shoulder and neck.  Start home PT and stretching.  Change Claritin to Xyzal or Allegra.  Continue flonase... start nasal saline spray 2-3 times daily.

## 2021-02-27 NOTE — Assessment & Plan Note (Signed)
Heat, ibupfrofen 800 mg  Three times daily with meals.  Start massage on shoulder and neck.  Start home PT and stretching.
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
## 2021-05-02 DIAGNOSIS — R0981 Nasal congestion: Secondary | ICD-10-CM

## 2021-05-04 MED ORDER — MONTELUKAST SODIUM 10 MG PO TABS: 10.0000 mg | ORAL_TABLET | Freq: Every day | ORAL | 3 refills | Status: DC

## 2021-05-17 ENCOUNTER — Encounter: Payer: Self-pay | Admitting: Family Medicine

## 2021-07-05 ENCOUNTER — Ambulatory Visit (INDEPENDENT_AMBULATORY_CARE_PROVIDER_SITE_OTHER): Payer: No Typology Code available for payment source | Admitting: Family Medicine

## 2021-07-05 ENCOUNTER — Encounter (INDEPENDENT_AMBULATORY_CARE_PROVIDER_SITE_OTHER): Payer: Self-pay | Admitting: Family Medicine

## 2021-07-05 ENCOUNTER — Other Ambulatory Visit: Payer: Self-pay

## 2021-07-05 VITALS — BP 115/77 | HR 64 | Temp 98.1°F | Ht 70.0 in | Wt 246.0 lb

## 2021-07-05 DIAGNOSIS — F419 Anxiety disorder, unspecified: Secondary | ICD-10-CM

## 2021-07-05 DIAGNOSIS — E559 Vitamin D deficiency, unspecified: Secondary | ICD-10-CM | POA: Diagnosis not present

## 2021-07-05 DIAGNOSIS — R0602 Shortness of breath: Secondary | ICD-10-CM | POA: Diagnosis not present

## 2021-07-05 DIAGNOSIS — R7301 Impaired fasting glucose: Secondary | ICD-10-CM

## 2021-07-05 DIAGNOSIS — F32A Depression, unspecified: Secondary | ICD-10-CM

## 2021-07-05 DIAGNOSIS — E66812 Obesity, class 2: Secondary | ICD-10-CM

## 2021-07-05 DIAGNOSIS — Z0289 Encounter for other administrative examinations: Secondary | ICD-10-CM

## 2021-07-05 DIAGNOSIS — R5383 Other fatigue: Secondary | ICD-10-CM

## 2021-07-05 DIAGNOSIS — Z91018 Allergy to other foods: Secondary | ICD-10-CM

## 2021-07-05 DIAGNOSIS — Z9189 Other specified personal risk factors, not elsewhere classified: Secondary | ICD-10-CM

## 2021-07-05 DIAGNOSIS — Z6835 Body mass index (BMI) 35.0-35.9, adult: Secondary | ICD-10-CM

## 2021-07-05 DIAGNOSIS — E739 Lactose intolerance, unspecified: Secondary | ICD-10-CM

## 2021-07-05 DIAGNOSIS — Z975 Presence of (intrauterine) contraceptive device: Secondary | ICD-10-CM

## 2021-07-05 NOTE — Progress Notes (Signed)
Chief Complaint:   OBESITY Misty [name] (MR# [id number]) is a 20 y.o. female who presents for evaluation and treatment of obesity and related comorbidities. Current BMI is Body mass index is 35.3 kg/m. Misty [name] has been struggling with her weight for many years and has been unsuccessful in either losing weight, maintaining weight loss, or reaching her healthy weight goal.  Misty [name] is currently in the action stage of change and ready to dedicate time achieving and maintaining a healthier weight. Misty [name] is interested in becoming our patient and working on intensive lifestyle modifications including (but not limited to) diet and exercise for weight loss.  Misty [name]'s habits were reviewed today and are as follows: Her family eats meals together, she thinks her family will eat healthier with her, her desired weight loss is 65 pounds, she started gaining weight after high school, her heaviest weight ever was 255 pounds, she is a picky eater and doesn't like to eat healthier foods, she craves Dr. Reino Kent, [employer], and [employer] ([place]), she snacks frequently in the evenings, she skips meals frequently, she is frequently drinking liquids with calories, she frequently makes poor food choices, she frequently eats larger portions than normal, and she struggles with emotional eating.  Depression Screen Misty [name]'s Food and Mood (modified PHQ-9) score was 8.  Depression screen PHQ 2/9 07/05/2021  Decreased Interest 1  Down, Depressed, Hopeless 1  PHQ - 2 Score 2  Altered sleeping 1  Tired, decreased energy 2  Change in appetite 1  Feeling bad or failure about yourself  0  Trouble concentrating 2  Moving slowly or fidgety/restless 0  Suicidal thoughts 0  PHQ-9 Score 8  Difficult doing work/chores Not difficult at all   Assessment/Plan:   1. Other fatigue Misty [name] denies daytime somnolence and reports waking up still tired. Patent has a history of symptoms of morning fatigue. Misty [name]  generally gets  6-8  hours of sleep per night, and states that she has generally restful sleep. Snoring is not present. Apneic episodes are not present. Epworth Sleepiness Score is 7.  Misty [name] does feel that her weight is causing her energy to be lower than it should be. Fatigue may be related to obesity, depression or many other causes. Labs will be ordered, and in the meanwhile, Misty [name] will focus on self care including making healthy food choices, increasing physical activity and focusing on stress reduction.  - EKG 12-Lead - Anemia panel - CBC with Differential/Platelet - Comprehensive metabolic panel - TSH - T4, free  2. SOB (shortness of breath) on exertion Misty [name] notes increasing shortness of breath with exercising and seems to be worsening over time with weight gain. She notes getting out of breath sooner with activity than she used to. This has gotten worse recently. Misty [name] denies shortness of breath at rest or orthopnea.  Misty [name] does feel that she gets out of breath more easily that she used to when she exercises. Misty [name]'s shortness of breath appears to be obesity related and exercise induced. She has agreed to work on weight loss and gradually increase exercise to treat her exercise induced shortness of breath. Will continue to monitor closely.  - Lipid panel  3. Fasting hyperglycemia Will check A1c and insulin level today.  - Hemoglobin A1c - Insulin, random  4. Vitamin D deficiency Misty [name] is not currently taking a vitamin D supplement.  Plan:  Will check vitamin D level today, as per below.  - VITAMIN D 25 Hydroxy (Vit-D Deficiency, Fractures)  5. IUD (intrauterine device)  in place Misty [name] has a Skyla IUD in place.  6. Lactose intolerance Misty [name] is lactose intolerance.  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  8. Anxiety and depression, with emotional eating Not at goal. Medication: None.  Misty [name] eats when stressed and when bored.  Plan:   Discussed cues and consequences, how thoughts affect eating, model of thoughts, feelings, and behaviors, and strategies for change by focusing on the cue. Discussed cognitive distortions, coping thoughts, and how to change your thoughts.  9. At risk for heart disease Due to Misty [name]'s current state of health and medical condition(s), she is at a higher risk for heart disease.  This puts the patient at much greater risk to subsequently develop cardiopulmonary conditions that can significantly affect patient's quality of life in a negative manner.    At least 8 minutes were spent on counseling Misty [name] about these concerns today. Evidence-based interventions for health behavior change were utilized today including the discussion of self monitoring techniques, problem-solving barriers, and SMART goal setting techniques.  Specifically, regarding patient's less desirable eating habits and patterns, we employed the technique of small changes when Misty [name] has not been able to fully commit to her prudent nutritional plan.  10. Class 2 severe obesity with serious comorbidity and body mass index (BMI) of 35.0 to 35.9 in adult, unspecified obesity type (HCC)  Misty [name] is currently in the action stage of change and her goal is to continue with weight loss efforts. I recommend Misty [name] begin the structured treatment plan as follows:  She has agreed to the Category 2 Plan.  Exercise goals: No exercise has been prescribed at this time.   Behavioral modification strategies: increasing lean protein intake, decreasing simple carbohydrates, increasing vegetables, and increasing water intake.  She was informed of the importance of frequent follow-up visits to maximize her success with intensive lifestyle modifications for her multiple health conditions. She was informed we would discuss her lab results at her next visit unless there is a critical issue that needs to be addressed sooner. Misty [name] agreed to keep her  next visit at the agreed upon time to discuss these results.  Objective:   Blood pressure 115/77, pulse 64, temperature 98.1 F (36.7 C), temperature source Oral, height 5\' 10"  (1.778 m), weight 246 lb (111.6 kg), SpO2 99 %. Body mass index is 35.3 kg/m.  EKG: Normal sinus rhythm, rate 89 bpm.  Indirect Calorimeter completed today shows a VO2 of 264 and a REE of 1814.  Her calculated basal metabolic rate is thus her basal metabolic rate is worse than expected.  General: Cooperative, alert, well developed, in no acute distress. HEENT: Conjunctivae and lids unremarkable. Cardiovascular: Regular rhythm.  Lungs: Normal work of breathing. Neurologic: No focal deficits.   Attestation Statements:   This is the patient's first visit at [facility]. The patient's NEW PATIENT PACKET was reviewed at length. Included in the packet: current and past health history, medications, allergies, ROS, gynecologic history (women only), surgical history, family history, social history, weight history, weight loss surgery history (for those that have had weight loss surgery), nutritional evaluation, mood and food questionnaire, PHQ9, Epworth questionnaire, sleep habits questionnaire, patient life and health improvement goals questionnaire. These will all be scanned into the patient's chart under media.   During the visit, I independently reviewed the patient's EKG, bioimpedance scale results, and indirect calorimeter results. I used this information to tailor a meal plan for the patient that will help her to lose weight and will  improve her obesity-related conditions going forward. I performed a medically necessary appropriate examination and/or evaluation. I discussed the assessment and treatment plan with the patient. The patient was provided an opportunity to ask questions and all were answered. The patient agreed with the plan and demonstrated an understanding of the instructions. Labs were  ordered at this visit and will be reviewed at the next visit unless more critical results need to be addressed immediately. Clinical information was updated and documented in the EMR.   I, Insurance claims handler, CMA, am acting as transcriptionist for Helane Rima, DO  I have reviewed the above documentation for accuracy and completeness, and I agree with the above. Helane Rima, DO

## 2021-07-06 LAB — COMPREHENSIVE METABOLIC PANEL
ALT: 12 IU/L (ref 0–32)
AST: 17 IU/L (ref 0–40)
Albumin/Globulin Ratio: 1.8 (ref 1.2–2.2)
Albumin: 4.6 g/dL (ref 3.9–5.0)
Alkaline Phosphatase: 91 IU/L (ref 44–121)
BUN/Creatinine Ratio: 8 — ABNORMAL LOW (ref 9–23)
BUN: 6 mg/dL (ref 6–20)
Bilirubin Total: 0.3 mg/dL (ref 0.0–1.2)
CO2: 23 mmol/L (ref 20–29)
Calcium: 9.5 mg/dL (ref 8.7–10.2)
Chloride: 101 mmol/L (ref 96–106)
Creatinine, Ser: 0.77 mg/dL (ref 0.57–1.00)
Globulin, Total: 2.6 g/dL (ref 1.5–4.5)
Glucose: 86 mg/dL (ref 65–99)
Potassium: 4.2 mmol/L (ref 3.5–5.2)
Sodium: 140 mmol/L (ref 134–144)
Total Protein: 7.2 g/dL (ref 6.0–8.5)
eGFR: 112 mL/min/{1.73_m2} (ref >59)

## 2021-07-06 LAB — CBC WITH DIFFERENTIAL/PLATELET
Basophils Absolute: 0 10*3/uL (ref 0.0–0.2)
Basos: 0 %
EOS (ABSOLUTE): 0.1 10*3/uL (ref 0.0–0.4)
Eos: 1 %
Hemoglobin: 13.7 g/dL (ref 11.1–15.9)
Immature Grans (Abs): 0 10*3/uL (ref 0.0–0.1)
Immature Granulocytes: 0 %
Lymphocytes Absolute: 1.9 10*3/uL (ref 0.7–3.1)
Lymphs: 22 %
MCH: 27.2 pg (ref 26.6–33.0)
MCHC: 32.8 g/dL (ref 31.5–35.7)
MCV: 83 fL (ref 79–97)
Monocytes Absolute: 0.6 10*3/uL (ref 0.1–0.9)
Monocytes: 7 %
Neutrophils Absolute: 5.8 10*3/uL (ref 1.4–7.0)
Neutrophils: 70 %
Platelets: 287 10*3/uL (ref 150–450)
RBC: 5.03 x10E6/uL (ref 3.77–5.28)
RDW: 13.6 % (ref 11.7–15.4)
WBC: 8.4 10*3/uL (ref 3.4–10.8)

## 2021-07-06 LAB — LIPID PANEL
Chol/HDL Ratio: 6.6 ratio — ABNORMAL HIGH (ref 0.0–4.4)
Cholesterol, Total: 231 mg/dL — ABNORMAL HIGH (ref 100–199)
HDL: 35 mg/dL — ABNORMAL LOW (ref >39)
LDL Chol Calc (NIH): 156 mg/dL — ABNORMAL HIGH (ref 0–99)
Triglycerides: 219 mg/dL — ABNORMAL HIGH (ref 0–149)
VLDL Cholesterol Cal: 40 mg/dL (ref 5–40)

## 2021-07-06 LAB — HEMOGLOBIN A1C
Est. average glucose Bld gHb Est-mCnc: 111 mg/dL
Hgb A1c MFr Bld: 5.5 % (ref 4.8–5.6)

## 2021-07-06 LAB — ANEMIA PANEL
Ferritin: 67 ng/mL (ref 15–150)
Folate, Hemolysate: 267 ng/mL
Folate, RBC: 639 ng/mL (ref >498)
Hematocrit: 41.8 % (ref 34.0–46.6)
Iron Saturation: 24 % (ref 15–55)
Iron: 71 ug/dL (ref 27–159)
Retic Ct Pct: 1.6 % (ref 0.6–2.6)
Total Iron Binding Capacity: 297 ug/dL (ref 250–450)
UIBC: 226 ug/dL (ref 131–425)
Vitamin B-12: 295 pg/mL (ref 232–1245)

## 2021-07-06 LAB — TSH: TSH: 2.04 u[IU]/mL (ref 0.450–4.500)

## 2021-07-06 LAB — VITAMIN D 25 HYDROXY (VIT D DEFICIENCY, FRACTURES): Vit D, 25-Hydroxy: 27.8 ng/mL — ABNORMAL LOW (ref 30.0–100.0)

## 2021-07-06 LAB — INSULIN, RANDOM: INSULIN: 21.2 u[IU]/mL (ref 2.6–24.9)

## 2021-07-06 LAB — T4, FREE: Free T4: 1.05 ng/dL (ref 0.82–1.77)

## 2021-07-19 ENCOUNTER — Ambulatory Visit (INDEPENDENT_AMBULATORY_CARE_PROVIDER_SITE_OTHER): Payer: PRIVATE HEALTH INSURANCE | Admitting: Family Medicine

## 2021-07-19 ENCOUNTER — Encounter (INDEPENDENT_AMBULATORY_CARE_PROVIDER_SITE_OTHER): Payer: Self-pay | Admitting: Family Medicine

## 2021-07-19 ENCOUNTER — Other Ambulatory Visit: Payer: Self-pay

## 2021-07-19 VITALS — BP 109/70 | HR 74 | Temp 98.3°F | Ht 70.0 in | Wt 246.0 lb

## 2021-07-19 DIAGNOSIS — Z6835 Body mass index (BMI) 35.0-35.9, adult: Secondary | ICD-10-CM

## 2021-07-19 DIAGNOSIS — E8881 Metabolic syndrome: Secondary | ICD-10-CM

## 2021-07-19 DIAGNOSIS — E782 Mixed hyperlipidemia: Secondary | ICD-10-CM

## 2021-07-19 DIAGNOSIS — E559 Vitamin D deficiency, unspecified: Secondary | ICD-10-CM

## 2021-07-19 MED ORDER — VITAMIN D (ERGOCALCIFEROL) 1.25 MG (50000 UNIT) PO CAPS: 50000.0000 [IU] | ORAL_CAPSULE | ORAL | 0 refills | Status: DC

## 2021-07-19 NOTE — Progress Notes (Signed)
Chief Complaint:   OBESITY Misty [name] is here to discuss her progress with her obesity treatment plan along with follow-up of her obesity related diagnoses.   Today's visit was #: 2 Starting weight: 246 lbs Starting date: 07/05/2021 Today's weight: 246 lbs Today's date: 07/19/2021 Weight change since last visit: 0 Total lbs lost to date: 0 Body mass index is 35.3 kg/m.   Current Meal Plan: the Category 2 Plan for 85% of the time.  Current Exercise Plan: Walking 1 mile 3-4 times per week.  Interim History:  Misty [name] has gone from a size 18 to a size 14. She has been adhering to her meal plan over 80% of the time. She has continued walking for exercise. Her muscle mass has increased and fat mass has decreased since our last visit.     07/05/2021 07/19/2021  Height 5\' 10"  (1.778 m) 5\' 10"  (1.778 m)  Weight 246 lb (111.6 kg) 246 lb (111.6 kg)  BMI (Calculated) 35.3 35.3  BLOOD PRESSURE - SYSTOLIC 115 109  BLOOD PRESSURE - DIASTOLIC 77 70  PULSE 64 74  RMR 1814   Waist Measurement  41 inches   Body Fat % 43.4 % 42.7 %  Muscle Mass (lbs) 132.4 lbs 134 lbs  Fat Mass (lbs) 106.8 lbs 105.2 lbs  Total Body Water (lbs) 88.8 lbs 88 lbs  Visceral Fat Rating  8 8   Assessment/Plan:   1. Mixed hyperlipidemia Course: Not at goal. Lipid-lowering medications: None.   Plan: Dietary changes: Increase soluble fiber, decrease simple carbohydrates, decrease saturated fat. Exercise changes: Moderate to vigorous-intensity aerobic activity 150 minutes per week or as tolerated.   Lab Results  Component Value Date   CHOL 231 (H) 07/05/2021   HDL 35 (L) 07/05/2021   LDLCALC 156 (H) 07/05/2021   TRIG 219 (H) 07/05/2021   CHOLHDL 6.6 (H) 07/05/2021   Lab Results  Component Value Date   ALT 12 07/05/2021   AST 17 07/05/2021   ALKPHOS 91 07/05/2021   BILITOT 0.3 07/05/2021   2. Insulin resistance Mild. Goal is HgbA1c < 5.7, fasting insulin closer to 5.  Medication: None.    Plan:  She  will continue to focus on protein-rich, low simple carbohydrate foods. We reviewed the importance of hydration, regular exercise for stress reduction, and restorative sleep.   Lab Results  Component Value Date   HGBA1C 5.5 07/05/2021   Lab Results  Component Value Date   INSULIN 21.2 07/05/2021   3. Vitamin D deficiency Not at goal.   Plan:  Start vitamin D 50,000 IU once weekly, as per below.  Follow-up for routine testing of Vitamin D, at least 2-3 times per year to avoid over-replacement.  Lab Results  Component Value Date   VD25OH 27.8 (L) 07/05/2021   - Start Vitamin D, Ergocalciferol, (DRISDOL) 1.25 MG (50000 UNIT) CAPS capsule; Take 1 capsule (50,000 Units total) by mouth every 7 (seven) days.  Dispense: 12 capsule; Refill: 0  4. Obesity, current BMI 35.3  Course: Misty [name] is currently in the action stage of change. As such, her goal is to continue with weight loss efforts.   Nutrition goals: She has agreed to the Category 2 Plan.   Exercise goals: For substantial health benefits, adults should do at least 150 minutes (2 hours and 30 minutes) a week of moderate-intensity, or 75 minutes (1 hour and 15 minutes) a week of vigorous-intensity aerobic physical activity, or an equivalent combination of moderate- and vigorous-intensity aerobic activity. Aerobic  activity should be performed in episodes of at least 10 minutes, and preferably, it should be spread throughout the week.  Behavioral modification strategies: increasing lean protein intake, decreasing simple carbohydrates, increasing vegetables, and increasing water intake.  Misty [name] has agreed to follow-up with our clinic in 3 weeks. She was informed of the importance of frequent follow-up visits to maximize her success with intensive lifestyle modifications for her multiple health conditions.   Objective:   Blood pressure 109/70, pulse 74, temperature 98.3 F (36.8 C), temperature source Oral, height 5\' 10"  (1.778 m),  weight 246 lb (111.6 kg), SpO2 98 %. Body mass index is 35.3 kg/m.  General: Cooperative, alert, well developed, in no acute distress. HEENT: Conjunctivae and lids unremarkable. Cardiovascular: Regular rhythm.  Lungs: Normal work of breathing. Neurologic: No focal deficits.   Lab Results  Component Value Date   CREATININE 0.77 07/05/2021   BUN 6 07/05/2021   NA 140 07/05/2021   K 4.2 07/05/2021   CL 101 07/05/2021   CO2 23 07/05/2021   Lab Results  Component Value Date   ALT 12 07/05/2021   AST 17 07/05/2021   ALKPHOS 91 07/05/2021   BILITOT 0.3 07/05/2021   Lab Results  Component Value Date   HGBA1C 5.5 07/05/2021   Lab Results  Component Value Date   INSULIN 21.2 07/05/2021   Lab Results  Component Value Date   TSH 2.040 07/05/2021   Lab Results  Component Value Date   CHOL 231 (H) 07/05/2021   HDL 35 (L) 07/05/2021   LDLCALC 156 (H) 07/05/2021   TRIG 219 (H) 07/05/2021   CHOLHDL 6.6 (H) 07/05/2021   Lab Results  Component Value Date   VD25OH 27.8 (L) 07/05/2021   Lab Results  Component Value Date   WBC 8.4 07/05/2021   HGB 13.7 07/05/2021   HCT 41.8 07/05/2021   MCV 83 07/05/2021   PLT 287 07/05/2021   Lab Results  Component Value Date   IRON 71 07/05/2021   TIBC 297 07/05/2021   FERRITIN 67 07/05/2021   Attestation Statements:   Reviewed by clinician on day of visit: allergies, medications, problem list, medical history, surgical history, family history, social history, and previous encounter notes.  I, 07/07/2021, CMA, am acting as transcriptionist for Insurance claims handler, DO  I have reviewed the above documentation for accuracy and completeness, and I agree with the above. Helane Rima, DO

## 2021-08-13 ENCOUNTER — Encounter (INDEPENDENT_AMBULATORY_CARE_PROVIDER_SITE_OTHER): Payer: Self-pay

## 2021-08-13 ENCOUNTER — Ambulatory Visit (INDEPENDENT_AMBULATORY_CARE_PROVIDER_SITE_OTHER): Payer: No Typology Code available for payment source | Admitting: Family Medicine

## 2021-08-15 ENCOUNTER — Other Ambulatory Visit: Payer: Self-pay

## 2021-08-15 ENCOUNTER — Ambulatory Visit: Payer: No Typology Code available for payment source | Admitting: Family Medicine

## 2021-08-15 ENCOUNTER — Encounter: Payer: Self-pay | Admitting: Family Medicine

## 2021-08-15 DIAGNOSIS — M25512 Pain in left shoulder: Secondary | ICD-10-CM | POA: Insufficient documentation

## 2021-08-15 MED ORDER — KETOROLAC TROMETHAMINE 30 MG/ML IJ SOLN
30.0000 mg | Freq: Once | INTRAMUSCULAR | Status: AC
  Administered 2021-08-15: 30 mg via INTRAMUSCULAR

## 2021-08-15 MED ORDER — PREDNISONE 20 MG PO TABS: ORAL_TABLET | ORAL | 0 refills | Status: DC

## 2021-08-15 NOTE — Patient Instructions (Signed)
I think you have left shoulder bursitis  Treat with toradol (anti inflammatory) shot today then start prednisone taper sent to your pharmacy.  Look at exercises provided today.  If not better with above, return to see our sports medicine doctor Dr Patsy Lager to discuss possible steroid injection into the shoulder.

## 2021-08-15 NOTE — Telephone Encounter (Signed)
I spoke with pt; for 4 days pt has had basically constant sharpe lt shoulder pain with pain level now of 5 - 6. Pt has not had a known injury; pt exercises 3 - 4 times a wk. Pt only does exercises that affect shoulder once a wk. 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 Pt does not have CP or SOB. Pt scheduled in office appt today with Dr Reece Agar at 3 PM. UC & ED precautions given and pt voiced understanding.no covid symptoms or known exposures per pt. Sending note to Dr Reece Agar and Misty Stanley CMA.

## 2021-08-15 NOTE — Telephone Encounter (Signed)
Will see today.  

## 2021-08-15 NOTE — Assessment & Plan Note (Addendum)
Story/exam most consistent with shoulder bursitis.  She declines steroid injection into shoulder today.  Will treat with toradol IM 30mg  today then start prednisone taper.  Continue ice for now, may also try heating pad. Provided with ROM exercises for shoulder bursitis.  Declines stronger pain medication for now.  If not improved with above, discussed return to see sports medicine for further evaluation/treatment options.

## 2021-08-15 NOTE — Progress Notes (Signed)
Patient ID: Jaiyla Granados, female    DOB: October 18, 2001, 19 y.o.   MRN: [id number]  This visit was conducted in person.  BP 118/72 (BP Location: Left Arm, Patient Position: Sitting, Cuff Size: Normal)   Pulse 80   Temp 98 F (36.7 C) (Temporal)   Ht 5\' 10"  (1.778 m)   Wt 251 lb (113.9 kg)   SpO2 97%   BMI 36.01 kg/m    CC: L shoulder pain  Subjective:   HPI: Denene Alamillo is a 20 y.o. female presenting on 08/15/2021 for Shoulder Pain (Left shoulder x 3-4 days ago; no injury or falls patient is aware of. Patient has been taking ibuprofen and applying ice. Ice helped some but stopped once numbness wore off. )   3-4 d h/o L shoulder pain without inciting trauma or injury. Pretty bad pain up to 8/10, worse at night time.  Treating with ibuprofen 600mg  Q8 hours and ice with limited benefit. Points to anterior L shoulder with radiation to back of shoulder. Worse with lifting arm overhead.  She has been going to gym more regularly but predominantly working abs and legs.   No prior h/o shoulder injuries.      Relevant past medical, surgical, family and social history reviewed and updated as indicated. Interim medical history since our last visit reviewed. Allergies and medications reviewed and updated. Outpatient Medications Prior to Visit  Medication Sig Dispense Refill   fexofenadine (ALLEGRA) 180 MG tablet Take 180 mg by mouth daily.     fluticasone (FLONASE) 50 MCG/ACT nasal spray Place 2 sprays into both nostrils daily.     Levonorgestrel 13.5 MG IUD Skyla 14 mcg/24 hrs (3 yrs) 13.5 mg intrauterine device  Take 1 device by intrauterine route.     montelukast (SINGULAIR) 10 MG tablet Take 1 tablet (10 mg total) by mouth at bedtime. 30 tablet 3   Vitamin D, Ergocalciferol, (DRISDOL) 1.25 MG (50000 UNIT) CAPS capsule Take 1 capsule (50,000 Units total) by mouth every 7 (seven) days. 12 capsule 0   No facility-administered medications prior to visit.     Per HPI unless  specifically indicated in ROS section below Review of Systems  Objective:  BP 118/72 (BP Location: Left Arm, Patient Position: Sitting, Cuff Size: Normal)   Pulse 80   Temp 98 F (36.7 C) (Temporal)   Ht 5\' 10"  (1.778 m)   Wt 251 lb (113.9 kg)   SpO2 97%   BMI 36.01 kg/m   Wt Readings from Last 3 Encounters:  08/15/21 251 lb (113.9 kg)  07/19/21 246 lb (111.6 kg)  07/05/21 246 lb (111.6 kg)      Physical Exam Vitals and nursing note reviewed.  Constitutional:      Appearance: Normal appearance. She is not ill-appearing.  Musculoskeletal:        General: Tenderness present. No swelling. Normal range of motion.     Cervical back: Normal range of motion and neck supple. No rigidity or tenderness.     Comments:  R shoulder WNL L shoulder exam: No deformity of shoulders on inspection. Marked pain to palpation of L subdeltoid bursa. Limited ROM in abduction and forward flexion past 90 degrees due to pain.  No pain or weakness with testing SITS in ext/int rotation. No pain with empty can sign. Neg Speed test. No impingement. No significant pain with rotation of humeral head in GH joint.   Neurological:     Mental Status: She is alert.  Assessment & Plan:  This visit occurred during the SARS-CoV-2 public health emergency.  Safety protocols were in place, including screening questions prior to the visit, additional usage of staff PPE, and extensive cleaning of exam room while observing appropriate contact time as indicated for disinfecting solutions.   Problem List Items Addressed This Visit     Acute pain of left shoulder    Story/exam most consistent with shoulder bursitis.  She declines steroid injection into shoulder today.  Will treat with toradol IM 30mg  today then start prednisone taper.  Continue ice for now, may also try heating pad. Provided with ROM exercises for shoulder bursitis.  Declines stronger pain medication for now.  If not improved with above,  discussed return to see sports medicine for further evaluation/treatment options.         Meds ordered this encounter  Medications   predniSONE (DELTASONE) 20 MG tablet    Sig: Take two tablets daily for 4 days followed by one tablet daily for 4 days    Dispense:  12 tablet    Refill:  0   ketorolac (TORADOL) 30 MG/ML injection 30 mg   No orders of the defined types were placed in this encounter.    Patient Instructions  I think you have left shoulder bursitis  Treat with toradol (anti inflammatory) shot today then start prednisone taper sent to your pharmacy.  Look at exercises provided today.  If not better with above, return to see our sports medicine doctor Dr to discuss possible steroid injection into the shoulder.   Follow up plan: No follow-ups on file.  Patsy Lager, MD

## 2021-09-12 IMAGING — DX DG ANKLE COMPLETE 3+V*R*
3 series · 3 of 3 positions shown · non-contrast
Comparison: None.

CLINICAL DATA: Pain after fall.

EXAM:
RIGHT ANKLE - COMPLETE 3+ VIEW

[ankle ap]
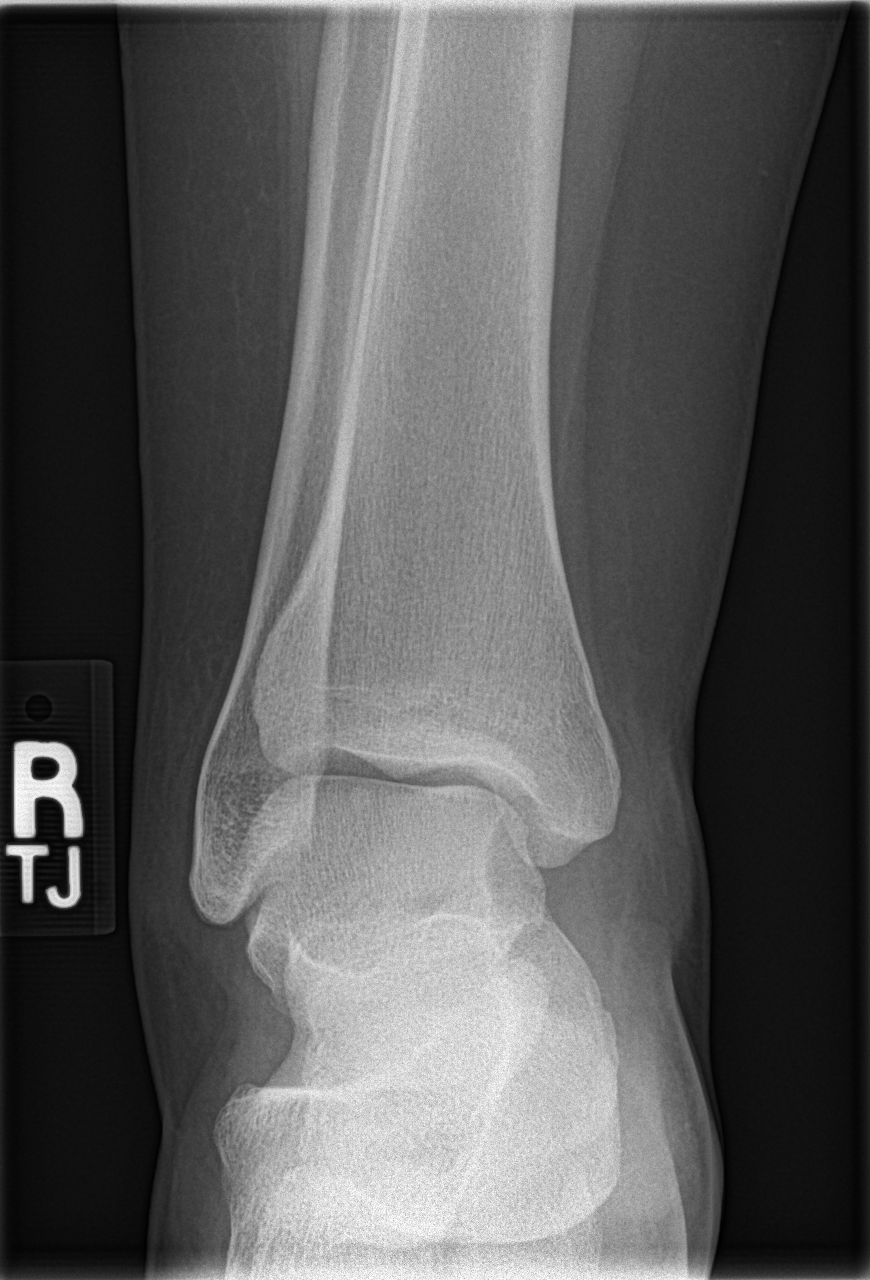

[ankle mortise]
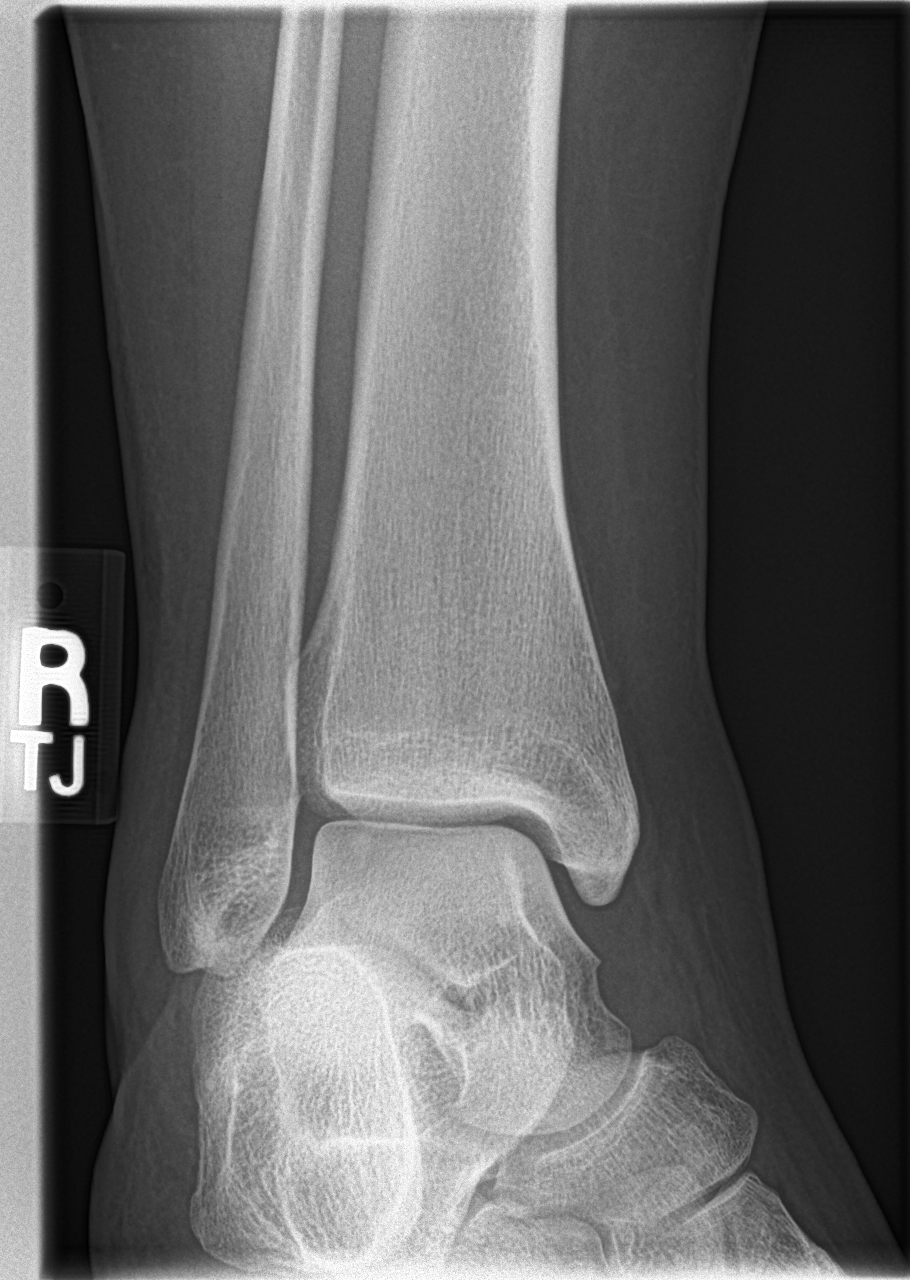

[ankle lat]
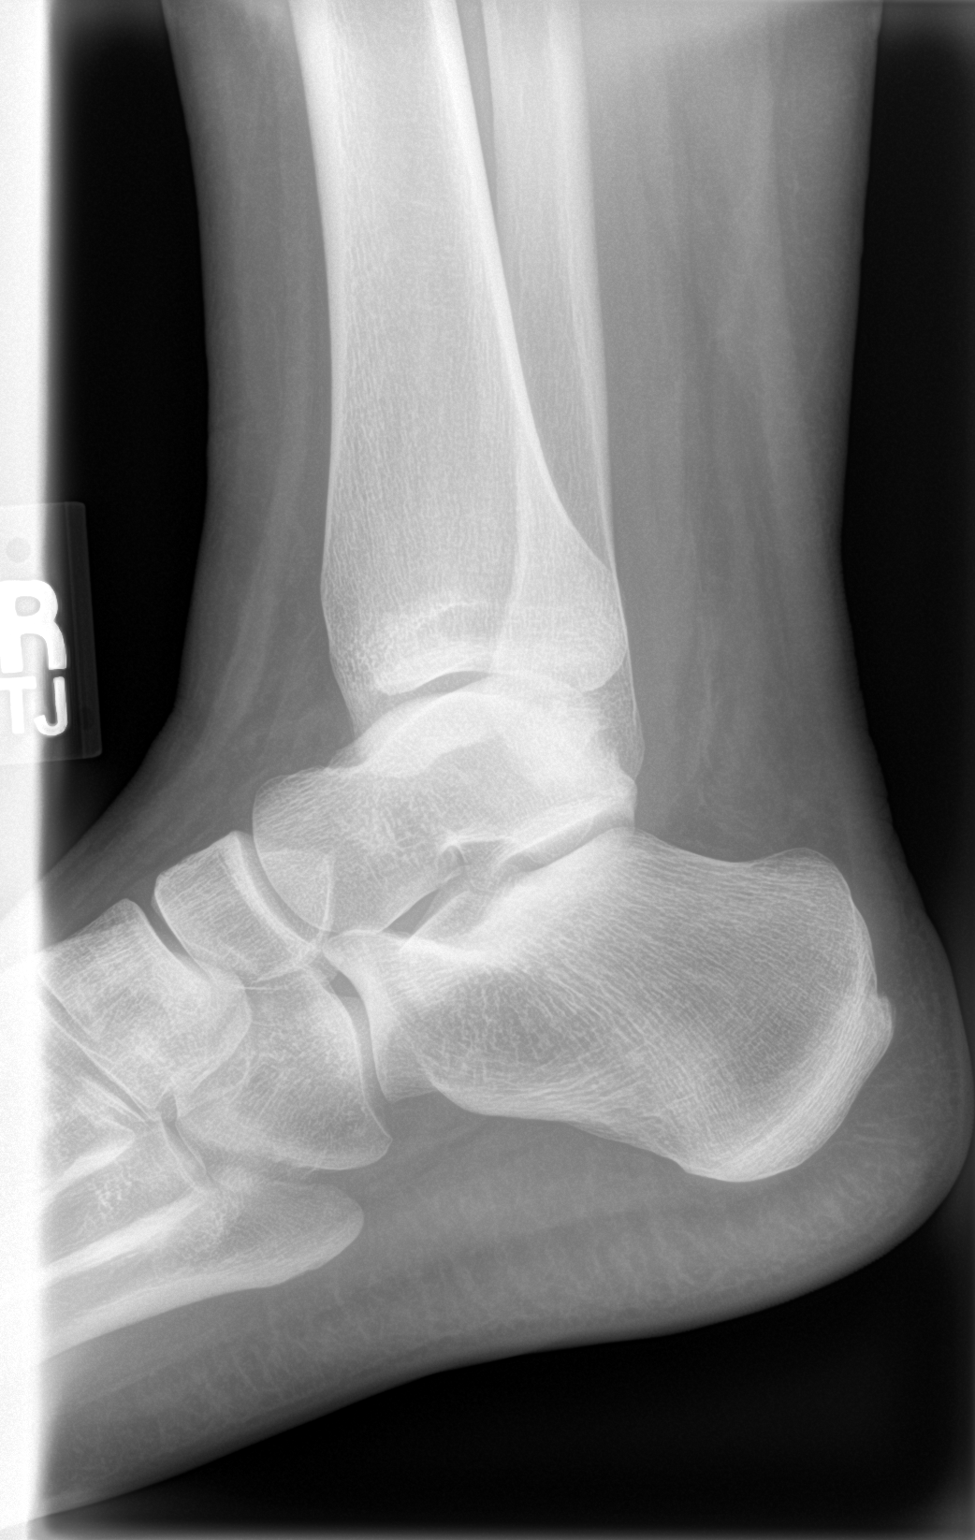

[3 of 3 positions shown; findings below may reference images not displayed]

FINDINGS: Soft tissue swelling.  No fractures or other bony abnormalities.
IMPRESSION: Soft tissue swelling.  No other abnormalities.

## 2021-09-17 ENCOUNTER — Other Ambulatory Visit: Payer: Self-pay

## 2021-09-17 ENCOUNTER — Ambulatory Visit (INDEPENDENT_AMBULATORY_CARE_PROVIDER_SITE_OTHER): Payer: No Typology Code available for payment source | Admitting: Family Medicine

## 2021-09-17 ENCOUNTER — Encounter (INDEPENDENT_AMBULATORY_CARE_PROVIDER_SITE_OTHER): Payer: Self-pay | Admitting: Family Medicine

## 2021-09-17 VITALS — BP 116/58 | HR 78 | Temp 97.7°F | Ht 70.0 in | Wt 250.0 lb

## 2021-09-17 DIAGNOSIS — Z6835 Body mass index (BMI) 35.0-35.9, adult: Secondary | ICD-10-CM

## 2021-09-17 DIAGNOSIS — E782 Mixed hyperlipidemia: Secondary | ICD-10-CM | POA: Diagnosis not present

## 2021-09-17 DIAGNOSIS — E559 Vitamin D deficiency, unspecified: Secondary | ICD-10-CM | POA: Diagnosis not present

## 2021-09-17 DIAGNOSIS — E8881 Metabolic syndrome: Secondary | ICD-10-CM | POA: Diagnosis not present

## 2021-09-17 DIAGNOSIS — E88819 Insulin resistance, unspecified: Secondary | ICD-10-CM

## 2021-09-17 MED ORDER — VITAMIN D (ERGOCALCIFEROL) 1.25 MG (50000 UNIT) PO CAPS: 50000.0000 [IU] | ORAL_CAPSULE | ORAL | 0 refills | Status: DC

## 2021-09-18 NOTE — Progress Notes (Signed)
Chief Complaint:   OBESITY Shahrzad is here to discuss her progress with her obesity treatment plan along with follow-up of her obesity related diagnoses. See Medical Weight Management Flowsheet for complete bioelectrical impedance results.  Today's visit was #: 3 Starting weight: 246 lbs Starting date: 07/05/2021 Weight change since last visit: +4 lbs Total lbs lost to date: +4 lbs  Nutrition Plan: Category 2 Plan for 79% of the time. Activity: Strength training/cardio for 60 minutes 4+ times per week.   Interim History: Lareta says she is exercising regularly.  She drinks around 80 ounces of water per day.  She reports sleeping well.  She feels that her clothes are fitting better.  Assessment/Plan:   1. Vitamin D deficiency Not at goal.  She is taking vitamin D 50,000 IU weekly.  Plan: Continue to take prescription Vitamin D @50 ,000 IU every week as prescribed.  Follow-up for routine testing of Vitamin D, at least 2-3 times per year to avoid over-replacement.  Lab Results  Component Value Date   VD25OH 27.8 (L) 07/05/2021   - Refill Vitamin D, Ergocalciferol, (DRISDOL) 1.25 MG (50000 UNIT) CAPS capsule; Take 1 capsule (50,000 Units total) by mouth every 7 (seven) days.  Dispense: 12 capsule; Refill: 0  2. Mixed hyperlipidemia Course: Not at goal. Lipid-lowering medications: None.   Plan: Dietary changes: Increase soluble fiber, decrease simple carbohydrates, decrease saturated fat. Exercise changes: Moderate to vigorous-intensity aerobic activity 150 minutes per week or as tolerated. We will continue to monitor along with PCP/specialists as it pertains to her weight loss journey.  Lab Results  Component Value Date   CHOL 231 (H) 07/05/2021   HDL 35 (L) 07/05/2021   LDLCALC 156 (H) 07/05/2021   TRIG 219 (H) 07/05/2021   CHOLHDL 6.6 (H) 07/05/2021   Lab Results  Component Value Date   ALT 12 07/05/2021   AST 17 07/05/2021   ALKPHOS 91 07/05/2021   BILITOT 0.3  07/05/2021   3. Insulin resistance Not at goal. Goal is HgbA1c < 5.7, fasting insulin closer to 5.  Medication: None.    Plan:  She will continue to focus on protein-rich, low simple carbohydrate foods. We reviewed the importance of hydration, regular exercise for stress reduction, and restorative sleep.   Lab Results  Component Value Date   HGBA1C 5.5 07/05/2021   Lab Results  Component Value Date   INSULIN 21.2 07/05/2021   4. Obesity, current BMI 35.3  Course: Atiana is currently in the action stage of change. As such, her goal is to continue with weight loss efforts.   Nutrition goals: She has agreed to the Category 2 Plan.   Exercise goals:  As is.  Behavioral modification strategies: increasing lean protein intake, decreasing simple carbohydrates, increasing vegetables, increasing water intake, and decreasing liquid calories.  Taylorann has agreed to follow-up with our clinic in 4 weeks. She was informed of the importance of frequent follow-up visits to maximize her success with intensive lifestyle modifications for her multiple health conditions.   Objective:   Blood pressure (!) 116/58, pulse 78, temperature 97.7 F (36.5 C), temperature source Oral, height 5\' 10"  (1.778 m), weight 250 lb (113.4 kg), SpO2 99 %. Body mass index is 35.87 kg/m.  General: Cooperative, alert, well developed, in no acute distress. HEENT: Conjunctivae and lids unremarkable. Cardiovascular: Regular rhythm.  Lungs: Normal work of breathing. Neurologic: No focal deficits.   Lab Results  Component Value Date   CREATININE 0.77 07/05/2021   BUN 6  07/05/2021   NA 140 07/05/2021   K 4.2 07/05/2021   CL 101 07/05/2021   CO2 23 07/05/2021   Lab Results  Component Value Date   ALT 12 07/05/2021   AST 17 07/05/2021   ALKPHOS 91 07/05/2021   BILITOT 0.3 07/05/2021   Lab Results  Component Value Date   HGBA1C 5.5 07/05/2021   Lab Results  Component Value Date   INSULIN 21.2  07/05/2021   Lab Results  Component Value Date   TSH 2.040 07/05/2021   Lab Results  Component Value Date   CHOL 231 (H) 07/05/2021   HDL 35 (L) 07/05/2021   LDLCALC 156 (H) 07/05/2021   TRIG 219 (H) 07/05/2021   CHOLHDL 6.6 (H) 07/05/2021   Lab Results  Component Value Date   VD25OH 27.8 (L) 07/05/2021   Lab Results  Component Value Date   WBC 8.4 07/05/2021   HGB 13.7 07/05/2021   HCT 41.8 07/05/2021   MCV 83 07/05/2021   PLT 287 07/05/2021   Lab Results  Component Value Date   IRON 71 07/05/2021   TIBC 297 07/05/2021   FERRITIN 67 07/05/2021   Attestation Statements:   Reviewed by clinician on day of visit: allergies, medications, problem list, medical history, surgical history, family history, social history, and previous encounter notes.  I, Insurance claims handler, CMA, am acting as transcriptionist for Helane Rima, DO  I have reviewed the above documentation for accuracy and completeness, and I agree with the above. -  Helane Rima, DO, MS, FAAFP, DABOM - Family and Bariatric Medicine.

## 2021-10-30 ENCOUNTER — Other Ambulatory Visit: Payer: Self-pay

## 2021-10-30 ENCOUNTER — Ambulatory Visit (INDEPENDENT_AMBULATORY_CARE_PROVIDER_SITE_OTHER): Payer: No Typology Code available for payment source | Admitting: Family Medicine

## 2021-10-30 ENCOUNTER — Encounter (INDEPENDENT_AMBULATORY_CARE_PROVIDER_SITE_OTHER): Payer: Self-pay | Admitting: Family Medicine

## 2021-10-30 VITALS — BP 125/72 | HR 71 | Temp 98.1°F | Ht 70.0 in | Wt 249.0 lb

## 2021-10-30 DIAGNOSIS — F3289 Other specified depressive episodes: Secondary | ICD-10-CM

## 2021-10-30 DIAGNOSIS — E559 Vitamin D deficiency, unspecified: Secondary | ICD-10-CM | POA: Diagnosis not present

## 2021-10-30 DIAGNOSIS — R632 Polyphagia: Secondary | ICD-10-CM

## 2021-10-30 DIAGNOSIS — Z6835 Body mass index (BMI) 35.0-35.9, adult: Secondary | ICD-10-CM

## 2021-11-01 MED ORDER — WEGOVY 0.25 MG/0.5ML ~~LOC~~ SOAJ: 0.2500 mg | SUBCUTANEOUS | 0 refills | Status: DC

## 2021-11-05 ENCOUNTER — Encounter (INDEPENDENT_AMBULATORY_CARE_PROVIDER_SITE_OTHER): Payer: Self-pay | Admitting: Family Medicine

## 2021-11-05 NOTE — Progress Notes (Signed)
Chief Complaint:   OBESITY Misty [name] is here to discuss her progress with her obesity treatment plan along with follow-up of her obesity related diagnoses. See Medical Weight Management Flowsheet for complete bioelectrical impedance results.  Today's visit was #: 4 Starting weight: 246 lbs Starting date: 07/05/2021 Weight change since last visit: 1 lb Total lbs lost to date: +3 lbs  Nutrition Plan: Category 2 Plan for 75% of the time.  Activity: Cardio/strength training for 60 minutes 2 times per week.   Interim History: Misty [name] has lost inches and feels more energetic.  Assessment/Plan:   1. Polyphagia Not optimized. Current treatment: None.    Plan:  Start Wegovy 0.25 mg subcutaneously weekly, as per below. She will continue to focus on protein-rich, low simple carbohydrate foods. We reviewed the importance of hydration, regular exercise for stress reduction, and restorative sleep.  - Start Semaglutide-Weight Management (WEGOVY) 0.25 MG/0.5ML SOAJ; Inject 0.25 mg into the skin once a week.  Dispense: 2 mL; Refill: 0  2. Vitamin D deficiency Not at goal. She is taking vitamin D 50,000 IU weekly.  Plan: Continue to take prescription Vitamin D @50 ,000 IU every week as prescribed.  Follow-up for routine testing of Vitamin D, at least 2-3 times per year to avoid over-replacement.  Lab Results  Component Value Date   VD25OH 27.8 (L) 07/05/2021   3. Other depression, with emotional eating Controlled. Medication: None.   Plan: Discussed cues and consequences, how thoughts affect eating, model of thoughts, feelings, and behaviors, and strategies for change by focusing on the cue. Discussed cognitive distortions, coping thoughts, and how to change your thoughts.  4. Obesity, current BMI 35.8  - Start Semaglutide-Weight Management (WEGOVY) 0.25 MG/0.5ML SOAJ; Inject 0.25 mg into the skin once a week.  Dispense: 2 mL; Refill: 0  Course: Misty [name] is currently in the action  stage of change. As such, her goal is to continue with weight loss efforts.   Nutrition goals: She has agreed to the Category 2 Plan.   Exercise goals:  As is.  Behavioral modification strategies: increasing lean protein intake, decreasing simple carbohydrates, increasing vegetables, increasing water intake, and decreasing liquid calories.  [name] has agreed to follow-up with our clinic in 4 weeks. She was informed of the importance of frequent follow-up visits to maximize her success with intensive lifestyle modifications for her multiple health conditions.   Objective:   Blood pressure 125/72, pulse 71, temperature 98.1 F (36.7 C), temperature source Oral, height 5\' 10"  (1.778 m), weight 249 lb (112.9 kg), SpO2 98 %. Body mass index is 35.73 kg/m.  General: Cooperative, alert, well developed, in no acute distress. HEENT: Conjunctivae and lids unremarkable. Cardiovascular: Regular rhythm.  Lungs: Normal work of breathing. Neurologic: No focal deficits.   Lab Results  Component Value Date   CREATININE 0.77 07/05/2021   BUN 6 07/05/2021   NA 140 07/05/2021   K 4.2 07/05/2021   CL 101 07/05/2021   CO2 23 07/05/2021   Lab Results  Component Value Date   ALT 12 07/05/2021   AST 17 07/05/2021   ALKPHOS 91 07/05/2021   BILITOT 0.3 07/05/2021   Lab Results  Component Value Date   HGBA1C 5.5 07/05/2021   Lab Results  Component Value Date   INSULIN 21.2 07/05/2021   Lab Results  Component Value Date   TSH 2.040 07/05/2021   Lab Results  Component Value Date   CHOL 231 (H) 07/05/2021   HDL 35 (L) 07/05/2021  LDLCALC 156 (H) 07/05/2021   TRIG 219 (H) 07/05/2021   CHOLHDL 6.6 (H) 07/05/2021   Lab Results  Component Value Date   VD25OH 27.8 (L) 07/05/2021   Lab Results  Component Value Date   WBC 8.4 07/05/2021   HGB 13.7 07/05/2021   HCT 41.8 07/05/2021   MCV 83 07/05/2021   PLT 287 07/05/2021   Lab Results  Component Value Date   IRON 71 07/05/2021    TIBC 297 07/05/2021   FERRITIN 67 07/05/2021   Attestation Statements:   Reviewed by clinician on day of visit: allergies, medications, problem list, medical history, surgical history, family history, social history, and previous encounter notes.  I, Insurance claims handler, CMA, am acting as transcriptionist for Helane Rima, DO  I have reviewed the above documentation for accuracy and completeness, and I agree with the above. -  Helane Rima, DO, MS, FAAFP, DABOM - Family and Bariatric Medicine.

## 2021-11-13 ENCOUNTER — Ambulatory Visit (INDEPENDENT_AMBULATORY_CARE_PROVIDER_SITE_OTHER): Payer: No Typology Code available for payment source | Admitting: Family Medicine

## 2021-11-13 ENCOUNTER — Other Ambulatory Visit: Payer: Self-pay

## 2021-11-13 ENCOUNTER — Encounter: Payer: Self-pay | Admitting: Family Medicine

## 2021-11-13 ENCOUNTER — Other Ambulatory Visit: Payer: Self-pay | Admitting: *Deleted

## 2021-11-13 VITALS — BP 92/60 | HR 88 | Temp 98.3°F | Ht 70.0 in | Wt 254.0 lb

## 2021-11-13 DIAGNOSIS — Z111 Encounter for screening for respiratory tuberculosis: Secondary | ICD-10-CM

## 2021-11-13 DIAGNOSIS — Z0000 Encounter for general adult medical examination without abnormal findings: Secondary | ICD-10-CM | POA: Diagnosis not present

## 2021-11-13 NOTE — Progress Notes (Signed)
Patient ID: Misty [name], female    DOB: 09-15-2001, 21 y.o.   MRN: [id number]  This visit was conducted in person.  BP 92/60    Pulse 88    Temp 98.3 F (36.8 C) (Temporal)    Ht 5' 10"  (1.778 m)    Wt 254 lb (115.2 kg)    SpO2 98%    BMI 36.45 kg/m    CC:  Chief Complaint  Patient presents with   Annual Exam    Health Screening for New Job    Subjective:   HPI: Misty [name] is a 21 y.o. 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   IUD with GYN.Marland Kitchen. upcoming appt to replace this  in 2 days.   Vit D def ; now on supplement    High cholesterol  LDL 156,  HDL  Diet:  High protein , low carb diet Exercise: 4 days a week 1 hr -1.5 hr    Followed by Dr. Juleen China for weight loss.. reviewed labs down with Dr. Juleen China from 06/2021     Relevant past medical, surgical, family and social history reviewed and updated as indicated. Interim medical history since our last visit reviewed. Allergies and medications reviewed and updated. Outpatient Medications Prior to Visit  Medication Sig Dispense Refill   fexofenadine (ALLEGRA) 180 MG tablet Take 180 mg by mouth daily.     fluticasone (FLONASE) 50 MCG/ACT nasal spray Place 2 sprays into both nostrils daily.     Levonorgestrel 13.5 MG IUD Skyla 14 mcg/24 hrs (3 yrs) 13.5 mg intrauterine device  Take 1 device by intrauterine route.     montelukast (SINGULAIR) 10 MG tablet Take 1 tablet (10 mg total) by mouth at bedtime. 30 tablet 3   Semaglutide-Weight Management (WEGOVY) 0.25 MG/0.5ML SOAJ Inject 0.25 mg into the skin once a week. 2 mL 0   SF 5000 PLUS 1.1 % CREA dental cream Take by mouth.     Vitamin D, Ergocalciferol, (DRISDOL) 1.25 MG (50000 UNIT) CAPS capsule Take 1 capsule (50,000 Units total) by mouth every 7 (seven) days. 12 capsule 0   No facility-administered medications prior to visit.     Per HPI unless  specifically indicated in ROS section below Review of Systems  Constitutional:  Negative for fatigue and fever.  HENT:  Negative for congestion.   Eyes:  Negative for pain.  Respiratory:  Negative for cough and shortness of breath.   Cardiovascular:  Negative for chest pain, palpitations and leg swelling.  Gastrointestinal:  Negative for abdominal pain.  Genitourinary:  Negative for dysuria and vaginal bleeding.  Musculoskeletal:  Negative for back pain.  Neurological:  Negative for syncope, light-headedness and headaches.  Psychiatric/Behavioral:  Negative for dysphoric mood.   Objective:  BP 92/60    Pulse 88    Temp 98.3 F (36.8 C) (Temporal)    Ht 5' 10"  (1.778 m)    Wt 254 lb (115.2 kg)    SpO2 98%    BMI 36.45 kg/m   Wt Readings from Last 3 Encounters:  11/13/21 254 lb (115.2 kg)  10/30/21 249 lb (112.9 kg)  09/17/21 250 lb (113.4 kg)      Physical Exam Vitals and nursing note reviewed.  Constitutional:      General: She is not in acute distress.    Appearance: Normal appearance. She is well-developed. She  is obese. She is not ill-appearing or toxic-appearing.  HENT:     Head: Normocephalic.     Right Ear: Hearing, tympanic membrane, ear canal and external ear normal.     Left Ear: Hearing, tympanic membrane, ear canal and external ear normal.     Nose: Nose normal.  Eyes:     General: Lids are normal. Lids are everted, no foreign bodies appreciated.     Conjunctiva/sclera: Conjunctivae normal.     Pupils: Pupils are equal, round, and reactive to light.  Neck:     Thyroid: No thyroid mass or thyromegaly.     Vascular: No carotid bruit.     Trachea: Trachea normal.  Cardiovascular:     Rate and Rhythm: Normal rate and regular rhythm.     Heart sounds: Normal heart sounds, S1 normal and S2 normal. No murmur heard.   No gallop.  Pulmonary:     Effort: Pulmonary effort is normal. No respiratory distress.     Breath sounds: Normal breath sounds. No wheezing, rhonchi or  rales.  Abdominal:     General: Bowel sounds are normal. There is no distension or abdominal bruit.     Palpations: Abdomen is soft. There is no fluid wave or mass.     Tenderness: There is no abdominal tenderness. There is no guarding or rebound.     Hernia: No hernia is present.  Musculoskeletal:     Cervical back: Normal range of motion and neck supple.  Lymphadenopathy:     Cervical: No cervical adenopathy.  Skin:    General: Skin is warm and dry.     Findings: No rash.  Neurological:     Mental Status: She is alert.     Cranial Nerves: No cranial nerve deficit.     Sensory: No sensory deficit.  Psychiatric:        Mood and Affect: Mood is not anxious or depressed.        Speech: Speech normal.        Behavior: Behavior normal. Behavior is cooperative.        Judgment: Judgment normal.      Results for orders placed or performed in visit on 07/05/21  Anemia panel  Result Value Ref Range   Total Iron Binding Capacity 297 250 - 450 ug/dL   UIBC 226 131 - 425 ug/dL   Iron 71 27 - 159 ug/dL   Iron Saturation 24 15 - 55 %   Vitamin B-12 295 232 - 1,245 pg/mL   Folate, Hemolysate 267.0 Not Estab. ng/mL   Hematocrit 41.8 34.0 - 46.6 %   Folate, RBC 639 >498 ng/mL   Ferritin 67 15 - 150 ng/mL   Retic Ct Pct 1.6 0.6 - 2.6 %  CBC with Differential/Platelet  Result Value Ref Range   WBC 8.4 3.4 - 10.8 x10E3/uL   RBC 5.03 3.77 - 5.28 x10E6/uL   Hemoglobin 13.7 11.1 - 15.9 g/dL   MCV 83 79 - 97 fL   MCH 27.2 26.6 - 33.0 pg   MCHC 32.8 31.5 - 35.7 g/dL   RDW 13.6 11.7 - 15.4 %   Platelets 287 150 - 450 x10E3/uL   Neutrophils 70 Not Estab. %   Lymphs 22 Not Estab. %   Monocytes 7 Not Estab. %   Eos 1 Not Estab. %   Basos 0 Not Estab. %   Neutrophils Absolute 5.8 1.4 - 7.0 x10E3/uL   Lymphocytes Absolute 1.9 0.7 - 3.1 x10E3/uL   Monocytes  Absolute 0.6 0.1 - 0.9 x10E3/uL   EOS (ABSOLUTE) 0.1 0.0 - 0.4 x10E3/uL   Basophils Absolute 0.0 0.0 - 0.2 x10E3/uL   Immature  Granulocytes 0 Not Estab. %   Immature Grans (Abs) 0.0 0.0 - 0.1 x10E3/uL  Comprehensive metabolic panel  Result Value Ref Range   Glucose 86 65 - 99 mg/dL   BUN 6 6 - 20 mg/dL   Creatinine, Ser 0.77 0.57 - 1.00 mg/dL   eGFR 112 >59 mL/min/1.73   BUN/Creatinine Ratio 8 (L) 9 - 23   Sodium 140 134 - 144 mmol/L   Potassium 4.2 3.5 - 5.2 mmol/L   Chloride 101 96 - 106 mmol/L   CO2 23 20 - 29 mmol/L   Calcium 9.5 8.7 - 10.2 mg/dL   Total Protein 7.2 6.0 - 8.5 g/dL   Albumin 4.6 3.9 - 5.0 g/dL   Globulin, Total 2.6 1.5 - 4.5 g/dL   Albumin/Globulin Ratio 1.8 1.2 - 2.2   Bilirubin Total 0.3 0.0 - 1.2 mg/dL   Alkaline Phosphatase 91 44 - 121 IU/L   AST 17 0 - 40 IU/L   ALT 12 0 - 32 IU/L  Hemoglobin A1c  Result Value Ref Range   Hgb A1c MFr Bld 5.5 4.8 - 5.6 %   Est. average glucose Bld gHb Est-mCnc 111 mg/dL  Insulin, random  Result Value Ref Range   INSULIN 21.2 2.6 - 24.9 uIU/mL  Lipid panel  Result Value Ref Range   Cholesterol, Total 231 (H) 100 - 199 mg/dL   Triglycerides 219 (H) 0 - 149 mg/dL   HDL 35 (L) >39 mg/dL   VLDL Cholesterol Cal 40 5 - 40 mg/dL   LDL Chol Calc (NIH) 156 (H) 0 - 99 mg/dL   Chol/HDL Ratio 6.6 (H) 0.0 - 4.4 ratio  VITAMIN D 25 Hydroxy (Vit-D Deficiency, Fractures)  Result Value Ref Range   Vit D, 25-Hydroxy 27.8 (L) 30.0 - 100.0 ng/mL  TSH  Result Value Ref Range   TSH 2.040 0.450 - 4.500 uIU/mL  T4, free  Result Value Ref Range   Free T4 1.05 0.82 - 1.77 ng/dL    This visit occurred during the SARS-CoV-2 public health emergency.  Safety protocols were in place, including screening questions prior to the visit, additional usage of staff PPE, and extensive cleaning of exam room while observing appropriate contact time as indicated for disinfecting solutions.   COVID 19 screen:  No recent travel or known exposure to COVID19 The patient denies respiratory symptoms of COVID 19 at this time. The importance of social distancing was discussed today.    Assessment and Plan   The patient's preventative maintenance and recommended screening tests for an annual wellness exam were reviewed in full today. Brought up to date unless services declined.  Counselled on the importance of diet, exercise, and its role in overall health and mortality. The patient's FH and SH was reviewed, including their home life, tobacco status, and drug and alcohol status.   Vaccines: Refused influenza,   recommended COVID vaccine, refused, refused Tdap as well.  Quantiferon test for tb ordered.  Pap/DVE:  per GYN 10/30/2021 Mammo: no early family history of breast cancer Colon:  no early family history  Smoking Status:none ETOH/ drug use:  occ/.none  Hep C:  due  HIV screen:  due   Form for childcare completed and scanned into chart.  Eliezer Lofts, MD

## 2021-11-14 ENCOUNTER — Telehealth (INDEPENDENT_AMBULATORY_CARE_PROVIDER_SITE_OTHER): Payer: Self-pay

## 2021-11-14 NOTE — Telephone Encounter (Signed)
Dr.Wallace °

## 2021-11-14 NOTE — Telephone Encounter (Signed)
Ref #[id number]  Checking to see if assistance is needed to get patients PA for [facility] approved.  Thank you

## 2021-11-16 LAB — QUANTIFERON-TB GOLD PLUS
Mitogen-NIL: >10 IU/mL
NIL: 0.04 IU/mL
QuantiFERON-TB Gold Plus: NEGATIVE
TB1-NIL: 0.01 IU/mL
TB2-NIL: 0.02 IU/mL

## 2021-11-23 ENCOUNTER — Telehealth: Payer: No Typology Code available for payment source | Admitting: Emergency Medicine

## 2021-11-23 ENCOUNTER — Encounter: Payer: Self-pay | Admitting: Emergency Medicine

## 2021-11-23 DIAGNOSIS — J329 Chronic sinusitis, unspecified: Secondary | ICD-10-CM

## 2021-11-23 DIAGNOSIS — B9789 Other viral agents as the cause of diseases classified elsewhere: Secondary | ICD-10-CM | POA: Diagnosis not present

## 2021-11-23 MED ORDER — AZELASTINE HCL 0.1 % NA SOLN: 1.0000 | Freq: Two times a day (BID) | NASAL | 12 refills | Status: AC

## 2021-11-23 MED ORDER — BENZONATATE 100 MG PO CAPS: 100.0000 mg | ORAL_CAPSULE | Freq: Two times a day (BID) | ORAL | 0 refills | Status: DC | PRN

## 2021-11-23 NOTE — Progress Notes (Signed)
I have spent 5 minutes in review of e-visit questionnaire, review and updating patient chart, medical decision making and response to patient.   Domenico Achord, PA-C    

## 2021-11-23 NOTE — Progress Notes (Signed)

## 2021-11-23 NOTE — Addendum Note (Signed)
Addended by: Rennis Harding on: 11/23/2021 07:43 PM   Modules accepted: Orders

## 2021-11-26 ENCOUNTER — Encounter: Payer: Self-pay | Admitting: Family Medicine

## 2021-11-26 ENCOUNTER — Ambulatory Visit: Payer: No Typology Code available for payment source | Admitting: Family Medicine

## 2021-11-26 ENCOUNTER — Other Ambulatory Visit: Payer: Self-pay

## 2021-11-26 VITALS — BP 128/76 | HR 109 | Temp 97.3°F | Ht 70.0 in | Wt 246.2 lb

## 2021-11-26 DIAGNOSIS — J0100 Acute maxillary sinusitis, unspecified: Secondary | ICD-10-CM | POA: Diagnosis not present

## 2021-11-26 DIAGNOSIS — J209 Acute bronchitis, unspecified: Secondary | ICD-10-CM | POA: Diagnosis not present

## 2021-11-26 DIAGNOSIS — J329 Chronic sinusitis, unspecified: Secondary | ICD-10-CM | POA: Insufficient documentation

## 2021-11-26 MED ORDER — PREDNISONE 10 MG PO TABS: ORAL_TABLET | ORAL | 0 refills | Status: DC

## 2021-11-26 MED ORDER — DOXYCYCLINE HYCLATE 100 MG PO TABS: 100.0000 mg | ORAL_TABLET | Freq: Two times a day (BID) | ORAL | 0 refills | Status: DC

## 2021-11-26 NOTE — Assessment & Plan Note (Signed)
S/p uri  Some rhonchi and persistent cough  Px prednisone (will also help congestion) Symptomatic care rev ER precautions reviewed

## 2021-11-26 NOTE — Progress Notes (Signed)
Subjective:    Patient ID: Misty [name], female    DOB: 04/25/2001, 21 y.o.   MRN: [id number]  This visit occurred during the SARS-CoV-2 public health emergency.  Safety protocols were in place, including screening questions prior to the visit, additional usage of staff PPE, and extensive cleaning of exam room while observing appropriate contact time as indicated for disinfecting solutions.   HPI 21 yo pt of Dr Diona Browner presents for uri symptoms   Wt Readings from Last 3 Encounters:  11/26/21 246 lb 4 oz (111.7 kg)  11/13/21 254 lb (115.2 kg)  10/30/21 249 lb (112.9 kg)   35.33 kg/m Symptoms since Thursday  Originally took sudafed   She was seen for e visit on 2/3  Recommended symptomatic care with nasal spray and tessalon   Congestion  Sinus pressure , some pain also on the right side around eye and roof of mouth   Cough - mucous is yellow  No wheezing  No rattling   No fever   Did covid test-at home/negative   Otc Mucinex DM or robitussin DM Sudafed PE  Nyquil Saline  Tends to get this every year  Has baseline allergies   Patient Active Problem List   Diagnosis Date Noted   Acute bronchitis 11/26/2021   Acute pain of left shoulder 08/15/2021   Trapezius strain 02/27/2021   Nasal congestion 02/27/2021   Low grade squamous intraepith lesion on cytologic smear cervix (lgsil) 10/23/2020   Right ankle injury, initial encounter 01/28/2020   Contact dermatitis due to chemicals 03/18/2019   GAD (generalized anxiety disorder) 03/18/2019   Depression, major, single episode, mild ([place]) 03/18/2019   Chronic insomnia 03/18/2019   Influenza 11/03/2018   Acute serous otitis media without rupture, left 05/27/2017   Allergic rhinitis 02/28/2014   Left ankle sprain 01/19/2013   CHILDHOOD OBESITY 06/11/2010   Past Medical History:  Diagnosis Date   Anxiety    Back pain    Depression    Lactose intolerance    Multiple food allergies    Seasonal allergies    Term  infant    NSVD, no complications   No past surgical history on file. Social History   Tobacco Use   Smoking status: Never   Smokeless tobacco: Never  Substance Use Topics   Alcohol use: No    Alcohol/week: 0.0 standard drinks   Drug use: No   Family History  Problem Relation Age of Onset   Depression Mother    Anxiety disorder Mother    Obesity Mother    Obesity Father    Depression Father    Hyperlipidemia Father    Hypertension Father    Diabetes Father    Sleep apnea Father    Asthma Sister    Asthma Sister    Cancer Maternal Grandmother        ?   Diabetes Maternal Grandfather    Hypertension Maternal Grandfather    Heart disease Paternal Grandmother        heart surgery   Diabetes Paternal Grandmother    Diabetes Paternal Grandfather    Heart disease Paternal Grandfather        CAD   Allergies  Allergen Reactions   Peanut (Diagnostic) Anaphylaxis    Throat Swells   Amoxicillin-Pot Clavulanate     Vomting. Tolerates amoxicillin   Current Outpatient Medications on File Prior to Visit  Medication Sig Dispense Refill   azelastine (ASTELIN) 0.1 % nasal spray Place 1 spray into both nostrils 2 (  two) times daily. Use in each nostril as directed 30 mL 12   benzonatate (TESSALON) 100 MG capsule Take 1 capsule (100 mg total) by mouth 2 (two) times daily as needed for cough. 20 capsule 0   fexofenadine (ALLEGRA) 180 MG tablet Take 180 mg by mouth daily.     fluticasone (FLONASE) 50 MCG/ACT nasal spray Place 2 sprays into both nostrils daily.     Levonorgestrel 13.5 MG IUD Skyla 14 mcg/24 hrs (3 yrs) 13.5 mg intrauterine device  Take 1 device by intrauterine route.     montelukast (SINGULAIR) 10 MG tablet Take 1 tablet (10 mg total) by mouth at bedtime. 30 tablet 3   Semaglutide-Weight Management (WEGOVY) 0.25 MG/0.5ML SOAJ Inject 0.25 mg into the skin once a week. 2 mL 0   SF 5000 PLUS 1.1 % CREA dental cream Take by mouth.     Vitamin D, Ergocalciferol, (DRISDOL)  1.25 MG (50000 UNIT) CAPS capsule Take 1 capsule (50,000 Units total) by mouth every 7 (seven) days. 12 capsule 0   No current facility-administered medications on file prior to visit.     Review of Systems  Constitutional:  Positive for fatigue. Negative for activity change, appetite change, fever and unexpected weight change.  HENT:  Positive for congestion, rhinorrhea, sinus pressure and sore throat. Negative for ear pain and voice change.   Eyes:  Positive for discharge. Negative for pain, redness and visual disturbance.       Eyes are watering   Respiratory:  Positive for cough. Negative for shortness of breath and wheezing.   Cardiovascular:  Negative for chest pain and palpitations.  Gastrointestinal:  Negative for abdominal pain, blood in stool, constipation and diarrhea.  Endocrine: Negative for polydipsia and polyuria.  Genitourinary:  Negative for dysuria, frequency and urgency.  Musculoskeletal:  Negative for arthralgias, back pain and myalgias.  Skin:  Negative for pallor and rash.  Allergic/Immunologic: Negative for environmental allergies.  Neurological:  Positive for headaches. Negative for dizziness and syncope.  Hematological:  Negative for adenopathy. Does not bruise/bleed easily.  Psychiatric/Behavioral:  Negative for decreased concentration and dysphoric mood. The patient is not nervous/anxious.       Objective:   Physical Exam Constitutional:      General: She is not in acute distress.    Appearance: Normal appearance. She is well-developed. She is obese. She is not ill-appearing, toxic-appearing or diaphoretic.  HENT:     Head: Normocephalic and atraumatic.     Comments: Nares are injected and congested      Right Ear: Tympanic membrane, ear canal and external ear normal.     Left Ear: Tympanic membrane, ear canal and external ear normal.     Nose: Congestion and rhinorrhea present.     Mouth/Throat:     Mouth: Mucous membranes are moist.     Pharynx:  Oropharynx is clear. No oropharyngeal exudate or posterior oropharyngeal erythema.     Comments: Clear pnd  Eyes:     General:        Right eye: No discharge.        Left eye: No discharge.     Conjunctiva/sclera: Conjunctivae normal.     Pupils: Pupils are equal, round, and reactive to light.     Comments: Eyes are constantly tearing   Cardiovascular:     Rate and Rhythm: Normal rate.     Heart sounds: Normal heart sounds.  Pulmonary:     Effort: Pulmonary effort is normal. No respiratory distress.  Breath sounds: No stridor. Wheezing and rhonchi present. No rales.     Comments: Mild wheeze on forced exp Scattered rhonchi   Chest:     Chest wall: No tenderness.  Musculoskeletal:     Cervical back: Normal range of motion and neck supple.  Lymphadenopathy:     Cervical: No cervical adenopathy.  Skin:    General: Skin is warm and dry.     Capillary Refill: Capillary refill takes less than 2 seconds.     Findings: No rash.  Neurological:     Mental Status: She is alert.     Cranial Nerves: No cranial nerve deficit.  Psychiatric:        Mood and Affect: Mood normal.          Assessment & Plan:   Problem List Items Addressed This Visit       Respiratory   Acute bronchitis - Primary    S/p uri  Some rhonchi and persistent cough  Px prednisone (will also help congestion) Symptomatic care rev ER precautions reviewed       Sinusitis    More R sided maxillary symptoms/also hurts roof of mouth on that side In setting of uri and also bronchitis Prednisone px for congestion  Doxycycline for bact infection  Rev sympt care  Rev side eff of prednisone  ER precautions reviewed Update if not starting to improve in a week or if worsening        Relevant Medications   predniSONE (DELTASONE) 10 MG tablet   doxycycline (VIBRA-TABS) 100 MG tablet

## 2021-11-26 NOTE — Patient Instructions (Signed)
Drink fluids and rest  mucinex DM is good for cough and congestion  Nasal saline for congestion as needed  Tylenol for fever or pain or headache  Please alert [place] if symptoms worsen (if severe or short of breath please go to the ER)   Take the prednisone for congestion and wheezing  Take the doxycycline for bacteria in sinuses    Try and get rest

## 2021-11-26 NOTE — Assessment & Plan Note (Signed)
More R sided maxillary symptoms/also hurts roof of mouth on that side In setting of uri and also bronchitis Prednisone px for congestion  Doxycycline for bact infection  Rev sympt care  Rev side eff of prednisone  ER precautions reviewed Update if not starting to improve in a week or if worsening

## 2021-12-11 ENCOUNTER — Other Ambulatory Visit: Payer: Self-pay

## 2021-12-11 ENCOUNTER — Ambulatory Visit (INDEPENDENT_AMBULATORY_CARE_PROVIDER_SITE_OTHER): Payer: No Typology Code available for payment source | Admitting: Family Medicine

## 2021-12-11 ENCOUNTER — Encounter (INDEPENDENT_AMBULATORY_CARE_PROVIDER_SITE_OTHER): Payer: Self-pay | Admitting: Family Medicine

## 2021-12-11 VITALS — BP 111/72 | HR 75 | Temp 98.0°F | Ht 70.0 in | Wt 246.0 lb

## 2021-12-11 DIAGNOSIS — E782 Mixed hyperlipidemia: Secondary | ICD-10-CM

## 2021-12-11 DIAGNOSIS — E669 Obesity, unspecified: Secondary | ICD-10-CM

## 2021-12-11 DIAGNOSIS — Z6835 Body mass index (BMI) 35.0-35.9, adult: Secondary | ICD-10-CM

## 2021-12-11 DIAGNOSIS — R632 Polyphagia: Secondary | ICD-10-CM | POA: Diagnosis not present

## 2021-12-11 DIAGNOSIS — E88819 Insulin resistance, unspecified: Secondary | ICD-10-CM

## 2021-12-11 DIAGNOSIS — E8881 Metabolic syndrome: Secondary | ICD-10-CM | POA: Diagnosis not present

## 2021-12-11 MED ORDER — WEGOVY 0.5 MG/0.5ML ~~LOC~~ SOAJ: 0.5000 mg | SUBCUTANEOUS | 1 refills | Status: DC

## 2021-12-13 NOTE — Progress Notes (Signed)
Chief Complaint:   OBESITY Misty [name] is here to discuss her progress with her obesity treatment plan along with follow-up of her obesity related diagnoses. See Medical Weight Management Flowsheet for complete bioelectrical impedance results.  Today's visit was #: 5 Starting weight: 246 lbs Starting date: 07/05/2021 Weight change since last visit: 3 lbs Total lbs lost to date: 0  Nutrition Plan: Category 2 Plan for 70% of the time.  Activity: Cardio/strength training for 90-120 minutes 4 times per week.  Anti-obesity medications: Wegovy 0.25 mg subcutaneously weekly. Reported side effects: None.  Interim History: Misty [name] says she is doing well with her medication.  She still has polyphagia but is doing less snacking.  She says she has better portion control.  She fits into a size 12!  Assessment/Plan:   1. Polyphagia Not at goal. Current treatment: Wegovy 0.25 mg subcutaneously weekly.    Plan: Increase Wegovy to 0.5 mg subcutaneously weekly.  She will continue to focus on protein-rich, low simple carbohydrate foods. We reviewed the importance of hydration, regular exercise for stress reduction, and restorative sleep.  - Increase Semaglutide-Weight Management (WEGOVY) 0.5 MG/0.5ML SOAJ; Inject 0.5 mg into the skin once a week.  Dispense: 2 mL; Refill: 1  2. Mixed hyperlipidemia Course: Not at goal. Lipid-lowering medications: None.   Plan: Dietary changes: Increase soluble fiber, decrease simple carbohydrates, decrease saturated fat. Exercise changes: Moderate to vigorous-intensity aerobic activity 150 minutes per week or as tolerated. We will continue to monitor along with PCP/specialists as it pertains to her weight loss journey.  Lab Results  Component Value Date   CHOL 231 (H) 07/05/2021   HDL 35 (L) 07/05/2021   LDLCALC 156 (H) 07/05/2021   TRIG 219 (H) 07/05/2021   CHOLHDL 6.6 (H) 07/05/2021   Lab Results  Component Value Date   ALT 12 07/05/2021   AST 17  07/05/2021   ALKPHOS 91 07/05/2021   BILITOT 0.3 07/05/2021   3. Insulin resistance Not at goal. Goal is HgbA1c < 5.7, fasting insulin closer to 5.  Medication: None.    Plan:  She will continue to focus on protein-rich, low simple carbohydrate foods. We reviewed the importance of hydration, regular exercise for stress reduction, and restorative sleep.   Lab Results  Component Value Date   HGBA1C 5.5 07/05/2021   Lab Results  Component Value Date   INSULIN 21.2 07/05/2021   4. Obesity, current BMI 35.4  - Increase Semaglutide-Weight Management (WEGOVY) 0.5 MG/0.5ML SOAJ; Inject 0.5 mg into the skin once a week.  Dispense: 2 mL; Refill: 1  Course: Misty [name] is currently in the action stage of change. As such, her goal is to continue with weight loss efforts.   Nutrition goals: She has agreed to the Category 2 Plan.   Exercise goals:  As is.  Behavioral modification strategies: increasing lean protein intake, decreasing simple carbohydrates, and increasing vegetables.  Misty [name] has agreed to follow-up with our clinic in 4 weeks. She was informed of the importance of frequent follow-up visits to maximize her success with intensive lifestyle modifications for her multiple health conditions.   Objective:   Blood pressure 111/72, pulse 75, temperature 98 F (36.7 C), temperature source Oral, height 5\' 10"  (1.778 m), weight 246 lb (111.6 kg), SpO2 99 %. Body mass index is 35.3 kg/m.  General: Cooperative, alert, well developed, in no acute distress. HEENT: Conjunctivae and lids unremarkable. Cardiovascular: Regular rhythm.  Lungs: Normal work of breathing. Neurologic: No focal deficits.   Lab Results  Component Value Date   CREATININE 0.77 07/05/2021   BUN 6 07/05/2021   NA 140 07/05/2021   K 4.2 07/05/2021   CL 101 07/05/2021   CO2 23 07/05/2021   Lab Results  Component Value Date   ALT 12 07/05/2021   AST 17 07/05/2021   ALKPHOS 91 07/05/2021   BILITOT 0.3  07/05/2021   Lab Results  Component Value Date   HGBA1C 5.5 07/05/2021   Lab Results  Component Value Date   INSULIN 21.2 07/05/2021   Lab Results  Component Value Date   TSH 2.040 07/05/2021   Lab Results  Component Value Date   CHOL 231 (H) 07/05/2021   HDL 35 (L) 07/05/2021   LDLCALC 156 (H) 07/05/2021   TRIG 219 (H) 07/05/2021   CHOLHDL 6.6 (H) 07/05/2021   Lab Results  Component Value Date   VD25OH 27.8 (L) 07/05/2021   Lab Results  Component Value Date   WBC 8.4 07/05/2021   HGB 13.7 07/05/2021   HCT 41.8 07/05/2021   MCV 83 07/05/2021   PLT 287 07/05/2021   Lab Results  Component Value Date   IRON 71 07/05/2021   TIBC 297 07/05/2021   FERRITIN 67 07/05/2021   Attestation Statements:   Reviewed by clinician on day of visit: allergies, medications, problem list, medical history, surgical history, family history, social history, and previous encounter notes.  I, Water quality scientist, CMA, am acting as transcriptionist for Briscoe Deutscher, DO  I have reviewed the above documentation for accuracy and completeness, and I agree with the above. -  Briscoe Deutscher, DO, MS, FAAFP, DABOM - Family and Bariatric Medicine.

## 2022-01-10 ENCOUNTER — Ambulatory Visit (INDEPENDENT_AMBULATORY_CARE_PROVIDER_SITE_OTHER): Payer: No Typology Code available for payment source | Admitting: Family Medicine

## 2022-01-24 ENCOUNTER — Ambulatory Visit (INDEPENDENT_AMBULATORY_CARE_PROVIDER_SITE_OTHER): Payer: No Typology Code available for payment source | Admitting: Family Medicine

## 2022-01-24 ENCOUNTER — Encounter (INDEPENDENT_AMBULATORY_CARE_PROVIDER_SITE_OTHER): Payer: Self-pay | Admitting: Family Medicine

## 2022-01-24 VITALS — BP 99/64 | HR 65 | Temp 97.9°F | Ht 71.0 in | Wt 241.0 lb

## 2022-01-24 DIAGNOSIS — J302 Other seasonal allergic rhinitis: Secondary | ICD-10-CM

## 2022-01-24 DIAGNOSIS — F3289 Other specified depressive episodes: Secondary | ICD-10-CM | POA: Diagnosis not present

## 2022-01-24 DIAGNOSIS — R632 Polyphagia: Secondary | ICD-10-CM

## 2022-01-24 DIAGNOSIS — Z6835 Body mass index (BMI) 35.0-35.9, adult: Secondary | ICD-10-CM

## 2022-01-24 DIAGNOSIS — E66812 Obesity, class 2: Secondary | ICD-10-CM

## 2022-01-24 DIAGNOSIS — E669 Obesity, unspecified: Secondary | ICD-10-CM | POA: Diagnosis not present

## 2022-01-24 MED ORDER — WEGOVY 1 MG/0.5ML ~~LOC~~ SOAJ: 1.0000 mg | SUBCUTANEOUS | 1 refills | Status: DC

## 2022-01-24 MED ORDER — WEGOVY 1.7 MG/0.75ML ~~LOC~~ SOAJ: 1.7000 mg | SUBCUTANEOUS | 1 refills | Status: DC

## 2022-01-29 NOTE — Progress Notes (Signed)
Chief Complaint:   OBESITY Misty [name] is here to discuss her progress with her obesity treatment plan along with follow-up of her obesity related diagnoses. See Medical Weight Management Flowsheet for complete bioelectrical impedance results.  Today's visit was #: 6 Starting weight: 246 lbs Starting date: 07/05/2021 Weight change since last visit: 5 lbs Total lbs lost to date: 5 lbs Total weight loss percentage to date: -2.03%  Nutrition Plan: Intuitive Eating for 100% of the time. Activity: Cardio/strength training for 90 minutes 4-5 times per week. Anti-obesity medications: Wegovy 0.5 mg subcutaneously weekly. Reported side effects: None.  Interim History: Misty [name] says she has been exercising regularly and doing less emotional snacking.  Assessment/Plan:   1. Polyphagia Improving, but not optimized. Current treatment: Wegovy 0.5 mg subcutaneously weekly.    Plan: Increase Wegovy to 1 mg subcutaneously weekly.  She will continue to focus on protein-rich, low simple carbohydrate foods. We reviewed the importance of hydration, regular exercise for stress reduction, and restorative sleep.  - Increase Semaglutide-Weight Management (WEGOVY) 1 MG/0.5ML SOAJ; Inject 1 mg into the skin once a week.  Dispense: 2 mL; Refill: 1 - Semaglutide-Weight Management (WEGOVY) 1.7 MG/0.75ML SOAJ; Inject 1.7 mg into the skin once a week.  Dispense: 3 mL; Refill: 1  2. Seasonal allergies Misty [name] is taking Singulair, Allegra, and using Flonase for seasonal allergies.  The current medical regimen is effective;  continue present plan and medications.  3. Other depression, with emotional eating Controlled. Medication: None.    Plan: Discussed cues and consequences, how thoughts affect eating, model of thoughts, feelings, and behaviors, and strategies for change by focusing on the cue. Discussed cognitive distortions, coping thoughts, and how to change your thoughts.  4. Obesity, current BMI  35.4  - Increase Semaglutide-Weight Management (WEGOVY) 1 MG/0.5ML SOAJ; Inject 1 mg into the skin once a week.  Dispense: 2 mL; Refill: 1 - Semaglutide-Weight Management (WEGOVY) 1.7 MG/0.75ML SOAJ; Inject 1.7 mg into the skin once a week.  Dispense: 3 mL; Refill: 1  Course: Misty [name] is currently in the action stage of change. As such, her goal is to continue with weight loss efforts.   Nutrition goals: She has agreed to [employer].   Exercise goals:  As is.  Behavioral modification strategies: increasing lean protein intake, decreasing simple carbohydrates, increasing vegetables, and increasing water intake.  Misty [name] has agreed to follow-up with our clinic in 6 weeks. She was informed of the importance of frequent follow-up visits to maximize her success with intensive lifestyle modifications for her multiple health conditions.   Objective:   Blood pressure 99/64, pulse 65, temperature 97.9 F (36.6 C), temperature source Oral, height 5\' 11"  (1.803 m), weight 241 lb (109.3 kg), SpO2 99 %. Body mass index is 33.61 kg/m.  General: Cooperative, alert, well developed, in no acute distress. HEENT: Conjunctivae and lids unremarkable. Cardiovascular: Regular rhythm.  Lungs: Normal work of breathing. Neurologic: No focal deficits.   Lab Results  Component Value Date   CREATININE 0.77 07/05/2021   BUN 6 07/05/2021   NA 140 07/05/2021   K 4.2 07/05/2021   CL 101 07/05/2021   CO2 23 07/05/2021   Lab Results  Component Value Date   ALT 12 07/05/2021   AST 17 07/05/2021   ALKPHOS 91 07/05/2021   BILITOT 0.3 07/05/2021   Lab Results  Component Value Date   HGBA1C 5.5 07/05/2021   Lab Results  Component Value Date   INSULIN 21.2 07/05/2021   Lab Results  Component Value Date   TSH 2.040 07/05/2021   Lab Results  Component Value Date   CHOL 231 (H) 07/05/2021   HDL 35 (L) 07/05/2021   LDLCALC 156 (H) 07/05/2021   TRIG 219 (H) 07/05/2021   CHOLHDL 6.6 (H)  07/05/2021   Lab Results  Component Value Date   VD25OH 27.8 (L) 07/05/2021   Lab Results  Component Value Date   WBC 8.4 07/05/2021   HGB 13.7 07/05/2021   HCT 41.8 07/05/2021   MCV 83 07/05/2021   PLT 287 07/05/2021   Lab Results  Component Value Date   IRON 71 07/05/2021   TIBC 297 07/05/2021   FERRITIN 67 07/05/2021   Attestation Statements:   Reviewed by clinician on day of visit: allergies, medications, problem list, medical history, surgical history, family history, social history, and previous encounter notes.  I, Water quality scientist, CMA, am acting as transcriptionist for Briscoe Deutscher, DO  I have reviewed the above documentation for accuracy and completeness, and I agree with the above. -  Briscoe Deutscher, DO, MS, FAAFP, DABOM - Family and Bariatric Medicine.

## 2022-05-29 ENCOUNTER — Encounter (INDEPENDENT_AMBULATORY_CARE_PROVIDER_SITE_OTHER): Payer: Self-pay

## 2022-10-24 ENCOUNTER — Encounter: Payer: Self-pay | Admitting: Family Medicine

## 2022-10-28 ENCOUNTER — Telehealth: Payer: Self-pay | Admitting: Family Medicine

## 2022-10-28 ENCOUNTER — Other Ambulatory Visit: Payer: Self-pay

## 2022-10-28 ENCOUNTER — Emergency Department
Admission: EM | Admit: 2022-10-28 | Discharge: 2022-10-28 | Disposition: A | Discharge status: Home / Self Care | Payer: No Typology Code available for payment source | Attending: Emergency Medicine | Admitting: Emergency Medicine

## 2022-10-28 DIAGNOSIS — L2381 Allergic contact dermatitis due to animal (cat) (dog) dander: Secondary | ICD-10-CM | POA: Diagnosis not present

## 2022-10-28 DIAGNOSIS — R220 Localized swelling, mass and lump, head: Secondary | ICD-10-CM | POA: Diagnosis present

## 2022-10-28 DIAGNOSIS — T7840XA Allergy, unspecified, initial encounter: Secondary | ICD-10-CM

## 2022-10-28 MED ORDER — CETIRIZINE HCL 10 MG PO TABS: 10.0000 mg | ORAL_TABLET | Freq: Two times a day (BID) | ORAL | 0 refills | Status: AC

## 2022-10-28 MED ORDER — PREDNISONE 20 MG PO TABS
60.0000 mg | ORAL_TABLET | Freq: Once | ORAL | Status: AC
  Administered 2022-10-28: 60 mg via ORAL
  Filled 2022-10-28: qty 3

## 2022-10-28 MED ORDER — PREDNISONE 10 MG PO TABS: ORAL_TABLET | ORAL | 0 refills | Status: AC

## 2022-10-28 MED ORDER — EPINEPHRINE 0.3 MG/0.3ML IJ SOAJ: 0.3000 mg | INTRAMUSCULAR | 0 refills | Status: AC | PRN

## 2022-10-28 NOTE — Telephone Encounter (Signed)
Patient called in and stated that she is having an allergic reaction to where her eyes are swollen and she can barely open them. She stated she has taking some Benadryl but nothing is helping. Sent over to access nurse.

## 2022-10-28 NOTE — Discharge Instructions (Signed)
Thank you for choosing us for your health care today!  Please see your primary doctor this week for a follow up appointment.   Sometimes, in the early stages of certain disease courses it is difficult to detect in the emergency department evaluation -- so, it is important that you continue to monitor your symptoms and call your doctor right away or return to the emergency department if you develop any new or worsening symptoms.  Please go to the following website to schedule new (and existing) patient appointments:   [email or web address]  If you do not have a primary doctor try calling the following clinics to establish care:  If you have insurance:  [facility] [phone] [address] [place] [address]   [facility]  [phone] [address] [place] [address] [place] [address]   If you do not have insurance:  [facility]  [phone] [address] [place] [address]   The following is another list of primary care offices in the area who are accepting new patients at this time.  Please reach out to one of them directly and let them know you would like to schedule an appointment to follow up on an Emergency Department visit, and/or to establish a new primary care provider (PCP).  There are likely other primary care clinics in the are who are accepting new patients, but this is an excellent place to start:  [facility] Lead physician: Dr Angela Bacigalupo [address] [place], [place] [address] [phone]  [facility] Lead Physician: Dr Krichna Sowles [address], [place], [place] [address] [phone]  [facility]  Lead Physician: Dr Megan Johnson [address], [place], [place] [address] [phone]  [facility] [place] [facility] Lead Physician: Dr Alex Karamalegos [address], [place], [place] [address] [phone]  [facility] [place] Lead Physician: Dr Laura Berglund [address], [place], [place] [address] [phone]   It was my pleasure to care for you today.   Amin Fornwalt S. Devorah Givhan, MD  

## 2022-10-28 NOTE — ED Triage Notes (Signed)
Pt comes with c/o allergic reaction to cat. Pt states this all started yesterday. Pt has noticeable swelling redness to bilateral eyes. Pt has taken lots of benadryl with no relief. Pt state some swelling to throat. Able to swallow and speak in complete sentences

## 2022-10-28 NOTE — ED Provider Notes (Signed)
[facility] Provider Note    Event Date/Time   First MD Initiated Contact with Patient 10/28/22 1201     (approximate)   History   Allergic Reaction   HPI  Misty [name] is a 22 y.o. female   Past medical history of patient presents to the emergency department with allergic reaction to cat exposure yesterday.  Known allergy to cat.  Since then she has had facial itching arm itching and some swelling to the face as well.  Watery itchy eyes.  Some shortness of breath.  No other symptoms reported.  Benadryl with minimal relief.    History was obtained via the patient.  Her friend is at bedside as an independent historian who corroborates information given above.      Physical Exam   Triage Vital Signs: ED Triage Vitals  Enc Vitals Group     BP 10/28/22 1122 131/87     Pulse Rate 10/28/22 1122 77     Resp 10/28/22 1122 18     Temp 10/28/22 1122 98 F (36.7 C)     Temp src --      SpO2 10/28/22 1122 100 %     Weight --      Height --      Head Circumference --      Peak Flow --      Pain Score 10/28/22 1121 3     Pain Loc --      Pain Edu? --      Excl. in GC? --     Most recent vital signs: Vitals:   10/28/22 1122 10/28/22 1247  BP: 131/87 126/77  Pulse: 77 75  Resp: 18 16  Temp: 98 F (36.7 C) 98.5 F (36.9 C)  SpO2: 100% 100%    General: Awake, no distress.  CV:  Good peripheral perfusion.  Resp:  Normal effort.  Abd:  No distention.  Other:  Redness to the face and bilateral arms, abdomen soft nontender, no wheezing, oropharynx appears normal without swelling.  No respiratory distress.   ED Results / Procedures / Treatments   Labs (all labs ordered are listed, but only abnormal results are displayed) Labs Reviewed - No data to display   PROCEDURES:  Critical Care performed: No  Procedures   MEDICATIONS ORDERED IN ED: Medications  predniSONE (DELTASONE) tablet 60 mg (60 mg Oral Given 10/28/22 1246)      IMPRESSION / MDM / ASSESSMENT AND PLAN / ED COURSE  I reviewed the triage vital signs and the nursing notes.                              Differential diagnosis includes, but is not limited to, drug reaction, anaphylaxis,   MDM: Known cat allergy, exposure is now removed ongoing symptoms despite antihistamine.  Will prescribe prednisone taper as well as second-generation antihistamine.  She will follow-up with PMD.  Does not appear to be anaphylaxis at this time, removed from exposure, EpiPen prescribed as well.  Patient's presentation is most consistent with acute presentation with potential threat to life or bodily function.       FINAL CLINICAL IMPRESSION(S) / ED DIAGNOSES   Final diagnoses:  Allergic reaction, initial encounter     Rx / DC Orders   ED Discharge Orders          Ordered    predniSONE (DELTASONE) 10 MG tablet  Daily  10/28/22 1224    cetirizine (ZYRTEC ALLERGY) 10 MG tablet  2 times daily        10/28/22 1224    EPINEPHrine 0.3 mg/0.3 mL IJ SOAJ injection  As needed        10/28/22 1224             Note:  This document was prepared using Dragon voice recognition software and may include unintentional dictation errors.    Lucillie Garfinkel, MD 10/28/22 1350

## 2022-10-28 NOTE — Telephone Encounter (Signed)
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 No difficulty breathing and no swelling in mouth, tongue, neck or throat. Pt is taking Benadryl, allegra and using eye drops without relief. Eyes are so swollen cannot distinguish where bridge of nose is due to swelling. No available appts at [facility] and [facility] is going to advise pt to go to ED. Per access nurse note attached pt is going to [facility] ED sending note to Dr Diona Browner and [place] pool.  [facility] - Client TELEPHONE ADVICE RECORD AccessNurse Patient Name: Misty [name] Gender: Female DOB: 12-Jun-2001 Age: 22 Y 31 M 30 D Return Phone Number: [phone] (Primary), [phone] (Secondary) Address: City/ State/ Zip: [place] [address] Client [facility] - Client Client Site [place] - Day Contact Type Call Who Is Calling Patient / Member / Family / Caregiver Call Type Triage / Clinical Relationship To Patient Self Return Phone Number [phone] (Primary) Chief Complaint Allergic reaction (unknown symptoms) Reason for Call Symptomatic / Request for [place] states that she is having an allergic reaction and both of her eyes are severely swollen. She was around a cat and her eyes are very itchy. She has taken Benadryl , soothing eye patches, and she has also been taking Allegra and nothing seems to help. Caller states that there is not any appts. available today at the clinic . Her doctors name is Dr. Eliezer Lofts. Translation No Nurse Assessment Nurse: Nicole Cella, RN, [place] Date/Time (Eastern Time): 10/28/2022 10:11:45 AM Confirm and document reason for call. If symptomatic, describe symptoms. ---Caller states that she is having an allergic reaction and both of her eyes are severely swollen. She was around a cat and her eyes are very itchy. She has  taken Benadryl , soothing eye patches, and she has also been taking Allegra and nothing seems to help. States yesterday was unable to open them. Had some relief with benadryl and icing. states being dry and tight. Does the patient have any new or worsening symptoms? ---Yes Will a triage be completed? ---Yes Related visit to physician within the last 2 weeks? ---Yes Does the PT have any chronic conditions? (i.e. diabetes, asthma, this includes High risk factors for pregnancy, etc.) ---Yes List chronic conditions. ---Allergies Is the patient pregnant or possibly pregnant? (Ask all females between the ages of 59-55) ---No Is this a behavioral health or substance abuse call? ---No PLEASE NOTE: All timestamps contained within this report are represented as [place] Standard Time. CONFIDENTIALTY NOTICE: This fax transmission is intended only for the addressee. It contains information that is legally privileged, confidential or otherwise protected from use or disclosure. If you are not the intended recipient, you are strictly prohibited from reviewing, disclosing, copying using or disseminating any of this information or taking any action in reliance on or regarding this information. If you have received this fax in error, please notify us immediately by telephone so that we can arrange for its return to [place]. Phone: [phone], Toll-Free: [phone], Fax: [phone] Page: 2 of 2 Call Id: [id number] Guidelines Guideline Title Affirmed Question Affirmed Notes Nurse Date/Time Eilene Ghazi Time) Eye - Swelling [1] SEVERE eyelid swelling (i.e., shut or almost) AND [2] involves both eyes (Exception: Itchy eyes, which are probably an allergic reaction.) [place], RN, Cortney 10/28/2022 10:16:09 AM Disp. Time Eilene Ghazi Time) Disposition Final User 10/28/2022 10:22:02 AM See HCP within 4  Hours (or PCP triage) Yes Gertie Gowda, RN, Cortney Final Disposition 10/28/2022 10:22:02 AM See HCP within 4 Hours  (or PCP triage) Yes [place], RN, Cortney Caller Disagree/Comply Comply Caller Understands Yes PreDisposition Did not know what to do Care Advice Given Per Guideline SEE HCP (OR PCP TRIAGE) WITHIN 4 HOURS: * IF OFFICE WILL BE OPEN: You need to be seen within the next 3 or 4 hours. Call your doctor (or NP/PA) now or as soon as the office opens. CALL BACK IF: * You become worse CARE ADVICE given per Eye - Swelling (Adult) guideline. Referrals [facility]

## 2022-10-28 NOTE — Telephone Encounter (Signed)
Given severe facial swelling.. agree with ER dispo

## 2022-10-30 ENCOUNTER — Telehealth: Payer: Self-pay

## 2022-10-30 NOTE — Telephone Encounter (Signed)
Transition Care Management Follow-up Telephone Call Date of discharge and from where: [facility] 10/28/22 How have you been since you were released from the hospital? better Any questions or concerns? No  Items Reviewed: Did the pt receive and understand the discharge instructions provided? Yes  Medications obtained and verified? Yes  Other? No  Any new allergies since your discharge? No  Dietary orders reviewed? No Do you have support at home? Yes    Functional Questionnaire: (I = Independent and D = Dependent) ADLs: I  Bathing/Dressing- I  Meal Prep- I  Eating- I  Maintaining continence- I  Transferring/Ambulation- I  Managing Meds- I  Follow up appointments reviewed:  PCP Hospital f/u appt confirmed? No  S Are transportation arrangements needed? No  If their condition worsens, is the pt aware to call PCP or go to the Emergency Dept.? Yes Was the patient provided with contact information for the PCP's office or ED? Yes Was to pt encouraged to call back with questions or concerns? Yes

## 2022-10-31 NOTE — Telephone Encounter (Signed)
Reviewed transitional care document. No red flags noted and patient will proceed with follow-up as scheduled.  

## 2023-01-02 DIAGNOSIS — Z8639 Personal history of other endocrine, nutritional and metabolic disease: Secondary | ICD-10-CM | POA: Diagnosis not present

## 2023-01-02 DIAGNOSIS — E559 Vitamin D deficiency, unspecified: Secondary | ICD-10-CM | POA: Diagnosis not present

## 2023-01-02 DIAGNOSIS — F908 Attention-deficit hyperactivity disorder, other type: Secondary | ICD-10-CM | POA: Diagnosis not present

## 2023-02-03 DIAGNOSIS — F908 Attention-deficit hyperactivity disorder, other type: Secondary | ICD-10-CM | POA: Diagnosis not present

## 2023-02-03 DIAGNOSIS — Z8639 Personal history of other endocrine, nutritional and metabolic disease: Secondary | ICD-10-CM | POA: Diagnosis not present

## 2023-02-03 DIAGNOSIS — E559 Vitamin D deficiency, unspecified: Secondary | ICD-10-CM | POA: Diagnosis not present

## 2023-02-03 DIAGNOSIS — R632 Polyphagia: Secondary | ICD-10-CM | POA: Diagnosis not present

## 2023-02-07 DIAGNOSIS — Z30431 Encounter for routine checking of intrauterine contraceptive device: Secondary | ICD-10-CM | POA: Diagnosis not present

## 2023-03-12 DIAGNOSIS — F908 Attention-deficit hyperactivity disorder, other type: Secondary | ICD-10-CM | POA: Diagnosis not present

## 2023-03-12 DIAGNOSIS — R632 Polyphagia: Secondary | ICD-10-CM | POA: Diagnosis not present

## 2023-03-12 DIAGNOSIS — Z6830 Body mass index (BMI) 30.0-30.9, adult: Secondary | ICD-10-CM | POA: Diagnosis not present

## 2023-03-12 DIAGNOSIS — E669 Obesity, unspecified: Secondary | ICD-10-CM | POA: Diagnosis not present

## 2023-04-09 DIAGNOSIS — E559 Vitamin D deficiency, unspecified: Secondary | ICD-10-CM | POA: Diagnosis not present

## 2023-04-09 DIAGNOSIS — E669 Obesity, unspecified: Secondary | ICD-10-CM | POA: Diagnosis not present

## 2023-04-09 DIAGNOSIS — F908 Attention-deficit hyperactivity disorder, other type: Secondary | ICD-10-CM | POA: Diagnosis not present

## 2023-04-09 DIAGNOSIS — R632 Polyphagia: Secondary | ICD-10-CM | POA: Diagnosis not present

## 2023-05-05 DIAGNOSIS — F418 Other specified anxiety disorders: Secondary | ICD-10-CM | POA: Diagnosis not present

## 2023-05-05 DIAGNOSIS — E559 Vitamin D deficiency, unspecified: Secondary | ICD-10-CM | POA: Diagnosis not present

## 2023-05-05 DIAGNOSIS — R632 Polyphagia: Secondary | ICD-10-CM | POA: Diagnosis not present

## 2023-05-05 DIAGNOSIS — F908 Attention-deficit hyperactivity disorder, other type: Secondary | ICD-10-CM | POA: Diagnosis not present

## 2023-06-30 DIAGNOSIS — E782 Mixed hyperlipidemia: Secondary | ICD-10-CM | POA: Diagnosis not present

## 2023-06-30 DIAGNOSIS — F908 Attention-deficit hyperactivity disorder, other type: Secondary | ICD-10-CM | POA: Diagnosis not present

## 2023-06-30 DIAGNOSIS — Z79899 Other long term (current) drug therapy: Secondary | ICD-10-CM | POA: Diagnosis not present

## 2023-06-30 DIAGNOSIS — R7301 Impaired fasting glucose: Secondary | ICD-10-CM | POA: Diagnosis not present

## 2023-06-30 DIAGNOSIS — E559 Vitamin D deficiency, unspecified: Secondary | ICD-10-CM | POA: Diagnosis not present

## 2023-07-29 DIAGNOSIS — N760 Acute vaginitis: Secondary | ICD-10-CM | POA: Diagnosis not present

## 2023-09-01 DIAGNOSIS — R632 Polyphagia: Secondary | ICD-10-CM | POA: Diagnosis not present

## 2023-09-01 DIAGNOSIS — E66811 Obesity, class 1: Secondary | ICD-10-CM | POA: Diagnosis not present

## 2023-09-01 DIAGNOSIS — F908 Attention-deficit hyperactivity disorder, other type: Secondary | ICD-10-CM | POA: Diagnosis not present

## 2023-09-01 DIAGNOSIS — Z9189 Other specified personal risk factors, not elsewhere classified: Secondary | ICD-10-CM | POA: Diagnosis not present

## 2023-09-01 DIAGNOSIS — E7800 Pure hypercholesterolemia, unspecified: Secondary | ICD-10-CM | POA: Diagnosis not present

## 2023-09-29 DIAGNOSIS — E559 Vitamin D deficiency, unspecified: Secondary | ICD-10-CM | POA: Diagnosis not present

## 2023-09-29 DIAGNOSIS — R632 Polyphagia: Secondary | ICD-10-CM | POA: Diagnosis not present

## 2023-09-29 DIAGNOSIS — F900 Attention-deficit hyperactivity disorder, predominantly inattentive type: Secondary | ICD-10-CM | POA: Diagnosis not present

## 2023-09-29 DIAGNOSIS — E66811 Obesity, class 1: Secondary | ICD-10-CM | POA: Diagnosis not present

## 2023-11-07 DIAGNOSIS — Z6831 Body mass index (BMI) 31.0-31.9, adult: Secondary | ICD-10-CM | POA: Diagnosis not present

## 2023-11-07 DIAGNOSIS — E785 Hyperlipidemia, unspecified: Secondary | ICD-10-CM | POA: Diagnosis not present

## 2023-11-07 DIAGNOSIS — F900 Attention-deficit hyperactivity disorder, predominantly inattentive type: Secondary | ICD-10-CM | POA: Diagnosis not present

## 2023-11-07 DIAGNOSIS — E559 Vitamin D deficiency, unspecified: Secondary | ICD-10-CM | POA: Diagnosis not present

## 2023-11-17 DIAGNOSIS — Z01419 Encounter for gynecological examination (general) (routine) without abnormal findings: Secondary | ICD-10-CM | POA: Diagnosis not present

## 2023-11-18 ENCOUNTER — Encounter: Payer: Self-pay | Admitting: Family Medicine

## 2023-11-19 NOTE — Telephone Encounter (Signed)
Marland Kitchen

## 2023-11-19 NOTE — Telephone Encounter (Signed)
Please see mychart note 11/19/23; Dr Ermalene Searing and pt have corresponded already this morning. Sending note to Dr Ermalene Searing as Misty [name].

## 2023-11-20 ENCOUNTER — Ambulatory Visit (INDEPENDENT_AMBULATORY_CARE_PROVIDER_SITE_OTHER): Payer: BC Managed Care – PPO | Admitting: Family Medicine

## 2023-11-20 ENCOUNTER — Encounter: Payer: Self-pay | Admitting: Family Medicine

## 2023-11-20 VITALS — BP 108/70 | HR 99 | Temp 97.8°F | Ht 70.0 in | Wt 230.4 lb

## 2023-11-20 DIAGNOSIS — R197 Diarrhea, unspecified: Secondary | ICD-10-CM | POA: Diagnosis not present

## 2023-11-20 DIAGNOSIS — R109 Unspecified abdominal pain: Secondary | ICD-10-CM | POA: Diagnosis not present

## 2023-11-20 NOTE — Progress Notes (Signed)
Patient ID: Misty [name], female    DOB: 01-17-2001, 23 y.o.   MRN: [id number]  This visit was conducted in person.  BP 108/70 (BP Location: Right Arm, Patient Position: Sitting, Cuff Size: Large)   Pulse 99   Temp 97.8 F (36.6 C) (Temporal)   Ht 5\' 10"  (1.778 m)   Wt 230 lb 6 oz (104.5 kg)   SpO2 98%   BMI 33.06 kg/m    CC:  Chief Complaint  Patient presents with   Diarrhea    Started Last Thursday   Abdominal Cramping    Subjective:   HPI: Misty [name] is a 23 y.o. female presenting on 11/20/2023 for Diarrhea (Started Last Thursday) and Abdominal Cramping  New onset  1 week ago.. started with abdominal cramping... then followed by diarrhea, multiple times a day... about 5 per day  No N/V. No blood in stool. Watery stool  Has been feeling ore tired. Yesterday felt much better... today  started back   with  BM soft, not watery... But feel like she still has to go and cramping.    Bilateral low down pain intermittent, 5-8 out of 10 on pain scale.   No fever.   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 Around someone with flu but only for 2 min.  She has no cough or congestion   She has been trying to eat BRAT, bland food.  She has been drinking lots of water a day.    No recent travel, no  creek water.      Saw GYN on Monday.. nml pelvic exam  Relevant past medical, surgical, family and social history reviewed and updated as indicated. Interim medical history since our last visit reviewed. Allergies and medications reviewed and updated. Outpatient Medications Prior to Visit  Medication Sig Dispense Refill   azelastine (ASTELIN) 0.1 % nasal spray Place 1 spray into both nostrils 2 (two) times daily. Use in each nostril as directed 30 mL 12   cetirizine (ZYRTEC ALLERGY) 10 MG tablet Take 1 tablet (10 mg total) by mouth 2 (two) times daily for 10 days. 20 tablet 0   EPINEPHrine 0.3 mg/0.3 mL IJ SOAJ injection Inject 0.3 mg into the muscle as needed for  anaphylaxis. 1 each 0   fluticasone (FLONASE) 50 MCG/ACT nasal spray Place 2 sprays into both nostrils daily.     Levonorgestrel 13.5 MG IUD Skyla 14 mcg/24 hrs (3 yrs) 13.5 mg intrauterine device  Take 1 device by intrauterine route.     rosuvastatin (CRESTOR) 10 MG tablet Take 10 mg by mouth at bedtime.     Semaglutide-Weight Management (WEGOVY) 0.5 MG/0.5ML SOAJ Inject 0.5 mg into the skin once a week. 2 mL 1   Vitamin D, Ergocalciferol, (DRISDOL) 1.25 MG (50000 UNIT) CAPS capsule Take 1 capsule (50,000 Units total) by mouth every 7 (seven) days. 12 capsule 0   montelukast (SINGULAIR) 10 MG tablet Take 1 tablet (10 mg total) by mouth at bedtime. 30 tablet 3   Semaglutide-Weight Management (WEGOVY) 1 MG/0.5ML SOAJ Inject 1 mg into the skin once a week. 2 mL 1   Semaglutide-Weight Management (WEGOVY) 1.7 MG/0.75ML SOAJ Inject 1.7 mg into the skin once a week. 3 mL 1   SF 5000 PLUS 1.1 % CREA dental cream Take by mouth.     No facility-administered medications prior to visit.     Per HPI unless specifically indicated in ROS section below Review of Systems  Constitutional:  Negative for fatigue  and fever.  HENT:  Negative for congestion.   Eyes:  Negative for pain.  Respiratory:  Negative for cough and shortness of breath.   Cardiovascular:  Negative for chest pain, palpitations and leg swelling.  Gastrointestinal:  Positive for abdominal pain, diarrhea and nausea.  Genitourinary:  Negative for dysuria and vaginal bleeding.  Musculoskeletal:  Negative for back pain.  Neurological:  Negative for syncope, light-headedness and headaches.  Psychiatric/Behavioral:  Negative for dysphoric mood.    Objective:  BP 108/70 (BP Location: Right Arm, Patient Position: Sitting, Cuff Size: Large)   Pulse 99   Temp 97.8 F (36.6 C) (Temporal)   Ht 5\' 10"  (1.778 m)   Wt 230 lb 6 oz (104.5 kg)   SpO2 98%   BMI 33.06 kg/m   Wt Readings from Last 3 Encounters:  11/20/23 230 lb 6 oz (104.5 kg)   01/24/22 241 lb (109.3 kg)  12/11/21 246 lb (111.6 kg)      Physical Exam Constitutional:      General: She is not in acute distress.    Appearance: Normal appearance. She is well-developed. She is not ill-appearing or toxic-appearing.  HENT:     Head: Normocephalic.     Right Ear: Hearing, tympanic membrane, ear canal and external ear normal. Tympanic membrane is not erythematous, retracted or bulging.     Left Ear: Hearing, tympanic membrane, ear canal and external ear normal. Tympanic membrane is not erythematous, retracted or bulging.     Nose: No mucosal edema or rhinorrhea.     Right Sinus: No maxillary sinus tenderness or frontal sinus tenderness.     Left Sinus: No maxillary sinus tenderness or frontal sinus tenderness.     Mouth/Throat:     Pharynx: Uvula midline.  Eyes:     General: Lids are normal. Lids are everted, no foreign bodies appreciated.     Conjunctiva/sclera: Conjunctivae normal.     Pupils: Pupils are equal, round, and reactive to light.  Neck:     Thyroid: No thyroid mass or thyromegaly.     Vascular: No carotid bruit.     Trachea: Trachea normal.  Cardiovascular:     Rate and Rhythm: Normal rate and regular rhythm.     Pulses: Normal pulses.     Heart sounds: Normal heart sounds, S1 normal and S2 normal. No murmur heard.    No friction rub. No gallop.  Pulmonary:     Effort: Pulmonary effort is normal. No tachypnea or respiratory distress.     Breath sounds: Normal breath sounds. No decreased breath sounds, wheezing, rhonchi or rales.  Abdominal:     General: Bowel sounds are normal.     Palpations: Abdomen is soft.     Tenderness: There is abdominal tenderness in the right lower quadrant, suprapubic area and left lower quadrant. There is no right CVA tenderness or left CVA tenderness.  Musculoskeletal:     Cervical back: Normal range of motion and neck supple.  Skin:    General: Skin is warm and dry.     Findings: No rash.  Neurological:      Mental Status: She is alert.  Psychiatric:        Mood and Affect: Mood is not anxious or depressed.        Speech: Speech normal.        Behavior: Behavior normal. Behavior is cooperative.        Thought Content: Thought content normal.        Judgment: Judgment  normal.       Results for orders placed or performed in visit on 11/13/21  QuantiFERON-TB Gold Plus   Collection Time: 11/13/21  3:51 PM  Result Value Ref Range   QuantiFERON-TB Gold Plus NEGATIVE NEGATIVE   NIL 0.04 IU/mL   Mitogen-NIL >10.00 IU/mL   TB1-NIL 0.01 IU/mL   TB2-NIL 0.02 IU/mL    Assessment and Plan  Acute diarrhea Assessment & Plan: Acute, symptoms most consistent with viral gastroenteritis.  She had recent pelvic exam with gynecologist.  Regular menses.  Symptoms not consistent with ovarian cyst or appendicitis. Encouraged her to continue with supportive care pushing fluids, rest and time.  She can use Tylenol as needed for cramping.  If diarrhea persist she will complete a GI pathogen panel. If she has changing symptoms she will let me know; if she has severe abdominal pain she will go to the ER.  If she does not progressively continue to improve we will consider possible imaging given pain 8 out of 10 at times and patient tearful in office.  Orders: -     Gastrointestinal Pathogen Pnl RT, PCR  Abdominal cramping    No follow-ups on file.   Kerby Nora, MD

## 2023-11-20 NOTE — Assessment & Plan Note (Signed)
Acute, symptoms most consistent with viral gastroenteritis.  She had recent pelvic exam with gynecologist.  Regular menses.  Symptoms not consistent with ovarian cyst or appendicitis. Encouraged her to continue with supportive care pushing fluids, rest and time.  She can use Tylenol as needed for cramping.  If diarrhea persist she will complete a GI pathogen panel. If she has changing symptoms she will let me know; if she has severe abdominal pain she will go to the ER.  If she does not progressively continue to improve we will consider possible imaging given pain 8 out of 10 at times and patient tearful in office.

## 2023-11-22 LAB — GASTROINTESTINAL PATHOGEN PNL
CampyloBacter Group: NOT DETECTED
Norovirus GI/GII: NOT DETECTED
Rotavirus A: NOT DETECTED
Salmonella species: NOT DETECTED
Shiga Toxin 1: NOT DETECTED
Shiga Toxin 2: NOT DETECTED
Shigella Species: NOT DETECTED
Vibrio Group: NOT DETECTED
Yersinia enterocolitica: NOT DETECTED

## 2023-11-25 ENCOUNTER — Encounter: Payer: Self-pay | Admitting: Family Medicine

## 2023-12-04 DIAGNOSIS — E785 Hyperlipidemia, unspecified: Secondary | ICD-10-CM | POA: Diagnosis not present

## 2023-12-04 DIAGNOSIS — F900 Attention-deficit hyperactivity disorder, predominantly inattentive type: Secondary | ICD-10-CM | POA: Diagnosis not present

## 2023-12-04 DIAGNOSIS — R632 Polyphagia: Secondary | ICD-10-CM | POA: Diagnosis not present

## 2024-01-01 DIAGNOSIS — E559 Vitamin D deficiency, unspecified: Secondary | ICD-10-CM | POA: Diagnosis not present

## 2024-01-01 DIAGNOSIS — F900 Attention-deficit hyperactivity disorder, predominantly inattentive type: Secondary | ICD-10-CM | POA: Diagnosis not present

## 2024-01-01 DIAGNOSIS — R632 Polyphagia: Secondary | ICD-10-CM | POA: Diagnosis not present

## 2024-01-01 DIAGNOSIS — E782 Mixed hyperlipidemia: Secondary | ICD-10-CM | POA: Diagnosis not present

## 2024-01-17 ENCOUNTER — Encounter: Payer: Self-pay | Admitting: Family Medicine

## 2024-01-19 ENCOUNTER — Encounter: Payer: Self-pay | Admitting: Family Medicine

## 2024-01-21 ENCOUNTER — Encounter: Payer: Self-pay | Admitting: Family Medicine

## 2024-01-21 ENCOUNTER — Ambulatory Visit (INDEPENDENT_AMBULATORY_CARE_PROVIDER_SITE_OTHER): Payer: Self-pay | Admitting: Family Medicine

## 2024-01-21 VITALS — BP 106/68 | HR 88 | Temp 98.6°F | Ht 70.0 in | Wt 239.0 lb

## 2024-01-21 DIAGNOSIS — J0100 Acute maxillary sinusitis, unspecified: Secondary | ICD-10-CM

## 2024-01-21 MED ORDER — AMOXICILLIN 500 MG PO CAPS: 1000.0000 mg | ORAL_CAPSULE | Freq: Two times a day (BID) | ORAL | 0 refills | Status: DC

## 2024-01-21 MED ORDER — PREDNISONE 20 MG PO TABS: ORAL_TABLET | ORAL | 0 refills | Status: DC

## 2024-01-21 NOTE — Progress Notes (Signed)
 Patient ID: Misty [name], female    DOB: 19-Sep-2001, 23 y.o.   MRN: [id number]  This visit was conducted in person.  BP 106/68   Pulse 88   Temp 98.6 F (37 C) (Temporal)   Ht 5\' 10"  (1.778 m)   Wt 239 lb (108.4 kg)   SpO2 98%   BMI 34.29 kg/m    CC:  Chief Complaint  Patient presents with   Nasal Congestion    Sx ongoing for 1 month severe sinus pressure Denies fever, ear pain, headaches Has tried a lot of OTC medications with no relief    Subjective:   HPI: Misty [name] is a 23 y.o. female presenting on 01/21/2024 for Nasal Congestion (Sx ongoing for 1 month severe sinus pressure/Denies fever, ear pain, headaches/Has tried a lot of OTC medications with no relief)   Date of onset:   month ago Initial symptoms included  runny nose, sneezing , congestion Symptoms progressed to  blocked nasal passage,  facial pressure and pain bilaterally.  No ear pain, occ pressure  Clear to yellow discharge.  No fever, no chills.  NO SOB, no wheeze.   Sick contacts:  none COVID testing:   none     She has tried to treat with  sudafed, tylenol severe sinus, allergra, Claritin,  Flonase 2 spay  per nostril     No history of chronic lung disease such as asthma or COPD. Non-smoker.   No recent antibiotics  Relevant past medical, surgical, family and social history reviewed and updated as indicated. Interim medical history since our last visit reviewed. Allergies and medications reviewed and updated. Outpatient Medications Prior to Visit  Medication Sig Dispense Refill   azelastine (ASTELIN) 0.1 % nasal spray Place 1 spray into both nostrils 2 (two) times daily. Use in each nostril as directed 30 mL 12   cetirizine (ZYRTEC ALLERGY) 10 MG tablet Take 1 tablet (10 mg total) by mouth 2 (two) times daily for 10 days. 20 tablet 0   EPINEPHrine 0.3 mg/0.3 mL IJ SOAJ injection Inject 0.3 mg into the muscle as needed for anaphylaxis. 1 each 0   fluticasone (FLONASE) 50 MCG/ACT nasal  spray Place 2 sprays into both nostrils daily.     Levonorgestrel 13.5 MG IUD Skyla 14 mcg/24 hrs (3 yrs) 13.5 mg intrauterine device  Take 1 device by intrauterine route.     rosuvastatin (CRESTOR) 10 MG tablet Take 10 mg by mouth at bedtime.     Semaglutide-Weight Management (WEGOVY) 0.5 MG/0.5ML SOAJ Inject 0.5 mg into the skin once a week. 2 mL 1   Vitamin D, Ergocalciferol, (DRISDOL) 1.25 MG (50000 UNIT) CAPS capsule Take 1 capsule (50,000 Units total) by mouth every 7 (seven) days. 12 capsule 0   No facility-administered medications prior to visit.     Per HPI unless specifically indicated in ROS section below Review of Systems  Constitutional:  Positive for fatigue. Negative for fever.  HENT:  Positive for congestion, ear discharge, ear pain, sinus pressure and sinus pain.   Eyes:  Negative for pain.  Respiratory:  Negative for cough and shortness of breath.   Cardiovascular:  Negative for chest pain, palpitations and leg swelling.  Gastrointestinal:  Negative for abdominal pain.  Genitourinary:  Negative for dysuria and vaginal bleeding.  Musculoskeletal:  Negative for back pain.  Neurological:  Negative for syncope, light-headedness and headaches.  Psychiatric/Behavioral:  Negative for dysphoric mood.    Objective:  BP 106/68   Pulse 88  Temp 98.6 F (37 C) (Temporal)   Ht 5\' 10"  (1.778 m)   Wt 239 lb (108.4 kg)   SpO2 98%   BMI 34.29 kg/m   Wt Readings from Last 3 Encounters:  01/21/24 239 lb (108.4 kg)  11/20/23 230 lb 6 oz (104.5 kg)  01/24/22 241 lb (109.3 kg)      Physical Exam Constitutional:      General: She is not in acute distress.    Appearance: She is well-developed. She is not ill-appearing or toxic-appearing.  HENT:     Head: Normocephalic.     Right Ear: Hearing, ear canal and external ear normal. A middle ear effusion is present. Tympanic membrane is not erythematous, retracted or bulging.     Left Ear: Hearing, ear canal and external ear  normal. A middle ear effusion is present. Tympanic membrane is not erythematous, retracted or bulging.     Nose: Mucosal edema and rhinorrhea present.     Right Sinus: Maxillary sinus tenderness present. No frontal sinus tenderness.     Left Sinus: Maxillary sinus tenderness present. No frontal sinus tenderness.     Mouth/Throat:     Pharynx: Uvula midline.  Eyes:     General: Lids are normal. Lids are everted, no foreign bodies appreciated.     Conjunctiva/sclera: Conjunctivae normal.     Pupils: Pupils are equal, round, and reactive to light.  Neck:     Thyroid: No thyroid mass or thyromegaly.     Vascular: No carotid bruit.     Trachea: Trachea normal.  Cardiovascular:     Rate and Rhythm: Normal rate and regular rhythm.     Pulses: Normal pulses.     Heart sounds: Normal heart sounds, S1 normal and S2 normal. No murmur heard.    No friction rub. No gallop.  Pulmonary:     Effort: Pulmonary effort is normal. No tachypnea or respiratory distress.     Breath sounds: Normal breath sounds. No decreased breath sounds, wheezing, rhonchi or rales.  Musculoskeletal:     Cervical back: Normal range of motion and neck supple.  Skin:    General: Skin is warm and dry.     Findings: No rash.  Neurological:     Mental Status: She is alert.  Psychiatric:        Mood and Affect: Mood is not anxious or depressed.        Speech: Speech normal.        Behavior: Behavior normal. Behavior is cooperative.        Judgment: Judgment normal.       Results for orders placed or performed in visit on 11/20/23  Gastrointestinal Pathogen Pnl RT, PCR   Collection Time: 11/20/23  3:57 PM  Result Value Ref Range   CampyloBacter Group NOT DETECTED NOT DETECTED   Salmonella species NOT DETECTED NOT DETECTED   Shigella Species NOT DETECTED NOT DETECTED   Vibrio Group NOT DETECTED NOT DETECTED   Yersinia enterocolitica NOT DETECTED NOT DETECTED   Shiga Toxin 1 NOT DETECTED NOT DETECTED   Shiga Toxin 2  NOT DETECTED NOT DETECTED   Norovirus GI/GII NOT DETECTED NOT DETECTED   Rotavirus A NOT DETECTED NOT DETECTED    Assessment and Plan  Acute non-recurrent maxillary sinusitis Assessment & Plan: Acute, most likely allergic with possible bacterial superinfection. She will continue Allegra 180 mg daily Flonase 2 sprays per nostril daily.   Start prednisone taper 60/40/20. Will treat with amoxicillin 500 mg 2 tablets  twice daily x 10 days. Continue nasal saline irrigation.  She will call if her symptoms or not improving as expected.  She will go to the emergency room if she has severe shortness of breath. Return and ER precautions provided   Other orders -     Amoxicillin; Take 2 capsules (1,000 mg total) by mouth 2 (two) times daily.  Dispense: 40 capsule; Refill: 0 -     predniSONE; 3 tabs by mouth daily x 3 days, then 2 tabs by mouth daily x 2 days then 1 tab by mouth daily x 2 days  Dispense: 15 tablet; Refill: 0    No follow-ups on file.   Kerby Nora, MD

## 2024-01-21 NOTE — Assessment & Plan Note (Signed)
 Acute, most likely allergic with possible bacterial superinfection. She will continue Allegra 180 mg daily Flonase 2 sprays per nostril daily.   Start prednisone taper 60/40/20. Will treat with amoxicillin 500 mg 2 tablets twice daily x 10 days. Continue nasal saline irrigation.  She will call if her symptoms or not improving as expected.  She will go to the emergency room if she has severe shortness of breath. Return and ER precautions provided

## 2024-03-29 DIAGNOSIS — E782 Mixed hyperlipidemia: Secondary | ICD-10-CM | POA: Diagnosis not present

## 2024-03-30 ENCOUNTER — Other Ambulatory Visit: Payer: Self-pay | Admitting: Family Medicine

## 2024-03-30 DIAGNOSIS — E782 Mixed hyperlipidemia: Secondary | ICD-10-CM

## 2024-03-30 DIAGNOSIS — E88819 Insulin resistance, unspecified: Secondary | ICD-10-CM

## 2024-03-31 ENCOUNTER — Ambulatory Visit
Admission: RE | Admit: 2024-03-31 | Discharge: 2024-03-31 | Discharge status: Home / Self Care | Source: Ambulatory Visit | Attending: Family Medicine | Admitting: Family Medicine

## 2024-03-31 DIAGNOSIS — E88819 Insulin resistance, unspecified: Secondary | ICD-10-CM

## 2024-03-31 DIAGNOSIS — E782 Mixed hyperlipidemia: Secondary | ICD-10-CM

## 2024-03-31 DIAGNOSIS — E785 Hyperlipidemia, unspecified: Secondary | ICD-10-CM | POA: Diagnosis not present

## 2024-04-20 ENCOUNTER — Ambulatory Visit: Payer: Self-pay | Admitting: *Deleted

## 2024-04-20 NOTE — Telephone Encounter (Signed)
 Copied from CRM [id number]. Topic: Clinical - Red Word Triage >> Apr 20, 2024  2:07 PM Suzen RAMAN wrote: Red Word that prompted transfer to Nurse Triage: painful sore on top of foot(possible infected per patient) Reason for Disposition  Large swelling or bruise (> 2 inches or 5 cm)    Wound on top of right foot looking infected from where a blister was.  Answer Assessment - Initial Assessment Questions 1. MECHANISM: How did the injury happen? (e.g., twisting injury, direct blow)      I have a wound on top of my right foot.   It was from shoes rubbing it raw.  It started out as a blister.  Now it's the size of a small bandaid.  It's red, itching and sore and spreading. 2. ONSET: When did the injury happen? (Minutes or hours ago)      About 2 days 3. LOCATION: Where is the injury located?      Top of my right foot. 4. APPEARANCE of INJURY: What does the injury look like?      Red, draining clear fluid with blood in it.  Sore and spreading 5. WEIGHT-BEARING: Can you put weight on that foot? Can you walk (four steps or more)?       I can walk fine. 6. SIZE: For cuts, bruises, or swelling, ask: How large is it? (e.g., inches or centimeters;  entire joint)      Size of a small Band Aid 7. PAIN: Is there pain? If Yes, ask: How bad is the pain?    (e.g., Scale 1-10; or mild, moderate, severe)   - NONE (0): no pain.   - MILD (1-3): doesn't interfere with normal activities.    - MODERATE (4-7): interferes with normal activities (e.g., work or school) or awakens from sleep, limping.    - SEVERE (8-10): excruciating pain, unable to do any normal activities, unable to walk.      Yes 8. TETANUS: For any breaks in the skin, ask: When was the last tetanus booster?     Not asked 9. OTHER SYMPTOMS: Do you have any other symptoms?      No I've put Neosporin on it and a Band Aid. 10. PREGNANCY: Is there any chance you are pregnant? When was your last menstrual period?       Not  asked  Protocols used: Ankle and Foot Injury-A-AH FYI Only or Action Required?: FYI only for provider.  Patient was last seen in primary care on 01/21/2024 by Avelina Greig BRAVO, MD. Called Nurse Triage reporting Foot Injury. Symptoms began yesterday. Interventions attempted: OTC medications: Neosporin. Symptoms are: gradually worsening.  Triage Disposition: See Physician Within 24 Hours  Patient/caregiver understands and will follow disposition?: Yes

## 2024-04-20 NOTE — Telephone Encounter (Signed)
 Appointment with Carrol Aurora, NP 04/21/2024 at 8:20 am.

## 2024-04-21 ENCOUNTER — Ambulatory Visit (INDEPENDENT_AMBULATORY_CARE_PROVIDER_SITE_OTHER): Admitting: General Practice

## 2024-04-21 ENCOUNTER — Encounter: Payer: Self-pay | Admitting: General Practice

## 2024-04-21 VITALS — BP 112/68 | HR 73 | Temp 98.1°F | Ht 70.0 in | Wt 242.0 lb

## 2024-04-21 DIAGNOSIS — L089 Local infection of the skin and subcutaneous tissue, unspecified: Secondary | ICD-10-CM | POA: Insufficient documentation

## 2024-04-21 MED ORDER — DOXYCYCLINE HYCLATE 100 MG PO TABS: 100.0000 mg | ORAL_TABLET | Freq: Two times a day (BID) | ORAL | 0 refills | Status: AC

## 2024-04-21 NOTE — Patient Instructions (Signed)
 Start Doxycycline  antibiotic for the infection. Take 1 tablet by mouth twice daily for 7 days. Avoid sun exposure while being on antibiotics.   As discussed, wash wound with soap and water.  Loose dressing if you have to wear shoes.   Follow up if symptoms worsen or do not get better.   It was a pleasure meeting you!

## 2024-04-21 NOTE — Progress Notes (Signed)
 Established Patient Office Visit  Subjective   Patient ID: Misty [name], female    DOB: 2001/02/28  Age: 23 y.o. MRN: [id number]  Chief Complaint  Patient presents with   Rash    On top of right foot x 4 days. Patient states she got new crocs and her foot got rubbed raw from them. Area was greenish yesterday and red and itchy; very painful. Has applied neosporin and bandaids to area.     Rash Pertinent negatives include no diarrhea, fever, shortness of breath or vomiting.    Misty [name] is a 23 year old female, patient of Dr. Avelina, with past medical history of   Blister on top of right foot: onset four days. Got new crocs and got a blister. Then her shoes kept rubbing it and the blister started to drain two days ago and then yesterday she developed and drainage in yellow colored drainage. She has itchiness and redness. She has tried neosporin and and bandaids to the area. This is causing her pain with walking but usually gets better with ibuprofen.  She denies any fever, chills, shortness of breath or chest pain.   Patient Active Problem List   Diagnosis Date Noted   Acute diarrhea 11/20/2023   Acute bronchitis 11/26/2021   Sinusitis 11/26/2021   Acute pain of left shoulder 08/15/2021   Trapezius strain 02/27/2021   Nasal congestion 02/27/2021   Low grade squamous intraepith lesion on cytologic smear cervix (lgsil) 10/23/2020   Right ankle injury, initial encounter 01/28/2020   Contact dermatitis due to chemicals 03/18/2019   GAD (generalized anxiety disorder) 03/18/2019   Depression, major, single episode, mild (HCC) 03/18/2019   Chronic insomnia 03/18/2019   Influenza 11/03/2018   Acute serous otitis media without rupture, left 05/27/2017   Allergic rhinitis 02/28/2014   Left ankle sprain 01/19/2013   CHILDHOOD OBESITY 06/11/2010   Past Medical History:  Diagnosis Date   Anxiety    Back pain    Depression    Lactose intolerance    Multiple food allergies     Seasonal allergies    Term infant    NSVD, no complications   History reviewed. No pertinent surgical history. Allergies  Allergen Reactions   Peanut (Diagnostic) Anaphylaxis    Throat Swells   Amoxicillin -Pot Clavulanate     Vomting. Tolerates amoxicillin          04/21/2024    8:12 AM 07/05/2021    7:48 AM 04/09/2019   10:37 AM  Depression screen PHQ 2/9  Decreased Interest 0 1 1  Down, Depressed, Hopeless 0 1 0  PHQ - 2 Score 0 2 1  Altered sleeping 1 1 3   Tired, decreased energy 1 2 2   Change in appetite 0 1 2  Feeling bad or failure about yourself  0 0 1  Trouble concentrating 0 2 0  Moving slowly or fidgety/restless 0 0 0  Suicidal thoughts 0 0 0  PHQ-9 Score 2 8 9   Difficult doing work/chores Not difficult at all Not difficult at all Somewhat difficult       04/21/2024    8:12 AM 04/09/2019   10:35 AM 03/18/2019    4:08 PM  GAD 7 : Generalized Anxiety Score  Nervous, Anxious, on Edge 0 3 2  Control/stop worrying 0 1 1  Worry too much - different things 0 3 2  Trouble relaxing 0 3 3  Restless 0 1 2  Easily annoyed or irritable 0 2 3  Afraid - awful  might happen 0 0 1  Total GAD 7 Score 0 13 14  Anxiety Difficulty Not difficult at all Somewhat difficult Somewhat difficult      Review of Systems  Constitutional:  Negative for chills and fever.  Respiratory:  Negative for shortness of breath.   Cardiovascular:  Negative for chest pain.  Gastrointestinal:  Negative for abdominal pain, constipation, diarrhea, heartburn, nausea and vomiting.  Genitourinary:  Negative for dysuria, frequency and urgency.  Skin:  Positive for itching and rash.       Blister with drainage on right foot.  Neurological:  Negative for dizziness and headaches.  Endo/Heme/Allergies:  Negative for polydipsia.  Psychiatric/Behavioral:  Negative for depression and suicidal ideas. The patient is not nervous/anxious.       Objective:     BP 112/68   Pulse 73   Temp 98.1 F (36.7 C)  (Temporal)   Ht 5' 10 (1.778 m)   Wt 242 lb (109.8 kg)   SpO2 99%   BMI 34.72 kg/m  BP Readings from Last 3 Encounters:  04/21/24 112/68  01/21/24 106/68  11/20/23 108/70   Wt Readings from Last 3 Encounters:  04/21/24 242 lb (109.8 kg)  01/21/24 239 lb (108.4 kg)  11/20/23 230 lb 6 oz (104.5 kg)      Physical Exam Vitals and nursing note reviewed.  Constitutional:      Appearance: Normal appearance.  Cardiovascular:     Rate and Rhythm: Normal rate and regular rhythm.     Pulses: Normal pulses.     Heart sounds: Normal heart sounds.  Pulmonary:     Effort: Pulmonary effort is normal.     Breath sounds: Normal breath sounds.  Musculoskeletal:       Feet:  Skin:     Neurological:     Mental Status: She is alert and oriented to person, place, and time.  Psychiatric:        Mood and Affect: Mood normal.        Behavior: Behavior normal.        Thought Content: Thought content normal.        Judgment: Judgment normal.      No results found for any visits on 04/21/24.     The ASCVD Risk score (Arnett DK, et al., 2019) failed to calculate for the following reasons:   The 2019 ASCVD risk score is only valid for ages 71 to 65    Assessment & Plan:  Right foot infection -     Doxycycline  Hyclate; Take 1 tablet (100 mg total) by mouth 2 (two) times daily for 7 days.  Dispense: 14 tablet; Refill: 0     Return if symptoms worsen or fail to improve.    Carrol Aurora, NP

## 2024-04-21 NOTE — Assessment & Plan Note (Signed)
 Located on top of right foot.  Red, painful and hot to touch.  No fever, chills, chest pain or shortness of breath.  Stable for outpatient treatment.  Start Doxycycline  100 mg BID for 7 days.  Educated on wound care to keep area clean, wash with soap and warm water. Cover with loose dressing if wearing closed toe shoes.  Educated on sun exposure while being on antibiotics.  ER precautions provided.   F/u with symptoms worsen or do not improve.

## 2024-04-27 ENCOUNTER — Encounter: Payer: Self-pay | Admitting: Family Medicine

## 2024-04-27 NOTE — Telephone Encounter (Signed)
 Seen Carrol Aurora on 04/21/24

## 2024-04-29 ENCOUNTER — Encounter: Payer: Self-pay | Admitting: Family Medicine

## 2024-04-29 ENCOUNTER — Ambulatory Visit (INDEPENDENT_AMBULATORY_CARE_PROVIDER_SITE_OTHER): Admitting: Family Medicine

## 2024-04-29 VITALS — BP 90/62 | HR 72 | Temp 97.7°F | Ht 70.0 in | Wt 242.0 lb

## 2024-04-29 DIAGNOSIS — L089 Local infection of the skin and subcutaneous tissue, unspecified: Secondary | ICD-10-CM

## 2024-04-29 NOTE — Assessment & Plan Note (Signed)
 Acute, now resolving.  Patient has completed antibiotic and there is no further infection. She still has healing scab and I encouraged her to keep this covered in dirty environments.  She can keep washing with antibiotic soap and applying Neosporin.  She is cleared to get in the pool.  Return and ER precautions provided.

## 2024-04-29 NOTE — Progress Notes (Signed)
 Patient ID: Misty [name], female    DOB: [birth year], 23 y.o.   MRN: [id number]  This visit was conducted in person.  BP 90/62   Pulse 72   Temp 97.7 F (36.5 C) (Temporal)   Ht 5' 10 (1.778 m)   Wt 242 lb (109.8 kg) Comment: Patient reported  SpO2 99%   BMI 34.72 kg/m    CC:  Chief Complaint  Patient presents with   Right Foot Infection    Follow Up..  Seen By Carrol on 04/21/24    Subjective:   HPI: Misty [name] is a 24 y.o. female presenting on 04/29/2024 for Right Foot Infection (Follow Up..  Seen By Carrol on 04/21/24)  She presents for follow-up on right foot infection. Reviewed initial office visit note for this issue with WENDI Aurora, NP on on April 21, 2024.  At that time she had noted 4 days of blister on the top of her right foot.  This started after she got new crocs and shoes were rubbing it.  2 days on July 1 she noted yellowish colored drainage increasing redness and itchiness. At appointment she was started on doxycycline  100 mg p.o. twice daily x 7 days.  Today she presents for reevaluation.  She has completed the antibiotics.  Pain has  resolved, no further discharge. Skin has scabbed over.  Redness has faded. She denies fever.       Relevant past medical, surgical, family and social history reviewed and updated as indicated. Interim medical history since our last visit reviewed. Allergies and medications reviewed and updated. Outpatient Medications Prior to Visit  Medication Sig Dispense Refill   azelastine  (ASTELIN ) 0.1 % nasal spray Place 1 spray into both nostrils 2 (two) times daily. Use in each nostril as directed 30 mL 12   cetirizine  (ZYRTEC  ALLERGY) 10 MG tablet Take 1 tablet (10 mg total) by mouth 2 (two) times daily for 10 days. 20 tablet 0   EPINEPHrine  0.3 mg/0.3 mL IJ SOAJ injection Inject 0.3 mg into the muscle as needed for anaphylaxis. 1 each 0   fluticasone  (FLONASE ) 50 MCG/ACT nasal spray Place 2 sprays into both nostrils daily.      hydrochlorothiazide (HYDRODIURIL) 12.5 MG tablet 1 tablet in the morning Orally Once a day     Levonorgestrel 13.5 MG IUD Skyla 14 mcg/24 hrs (3 yrs) 13.5 mg intrauterine device  Take 1 device by intrauterine route.     montelukast  (SINGULAIR ) 10 MG tablet TAKE ONE TABLET BY MOUTH ONCE A DAY for 30     rosuvastatin (CRESTOR) 10 MG tablet Take 10 mg by mouth at bedtime.     Semaglutide -Weight Management (WEGOVY ) 0.5 MG/0.5ML SOAJ Inject 0.5 mg into the skin once a week. 2 mL 1   Vitamin D , Ergocalciferol , (DRISDOL ) 1.25 MG (50000 UNIT) CAPS capsule Take 1 capsule (50,000 Units total) by mouth every 7 (seven) days. 12 capsule 0   No facility-administered medications prior to visit.     Per HPI unless specifically indicated in ROS section below Review of Systems  Constitutional:  Negative for fatigue and fever.  HENT:  Negative for congestion.   Eyes:  Negative for pain.  Respiratory:  Negative for cough and shortness of breath.   Cardiovascular:  Negative for chest pain, palpitations and leg swelling.  Gastrointestinal:  Negative for abdominal pain.  Genitourinary:  Negative for dysuria and vaginal bleeding.  Musculoskeletal:  Negative for back pain.  Skin:  Positive for rash.  Neurological:  Negative  for syncope, light-headedness and headaches.  Psychiatric/Behavioral:  Negative for dysphoric mood.    Objective:  BP 90/62   Pulse 72   Temp 97.7 F (36.5 C) (Temporal)   Ht 5' 10 (1.778 m)   Wt 242 lb (109.8 kg) Comment: Patient reported  SpO2 99%   BMI 34.72 kg/m   Wt Readings from Last 3 Encounters:  04/29/24 242 lb (109.8 kg)  04/21/24 242 lb (109.8 kg)  01/21/24 239 lb (108.4 kg)      Physical Exam Constitutional:      General: She is not in acute distress.    Appearance: Normal appearance. She is well-developed. She is not ill-appearing or toxic-appearing.  HENT:     Head: Normocephalic.     Right Ear: Hearing, tympanic membrane, ear canal and external ear normal.  Tympanic membrane is not erythematous, retracted or bulging.     Left Ear: Hearing, tympanic membrane, ear canal and external ear normal. Tympanic membrane is not erythematous, retracted or bulging.     Nose: No mucosal edema or rhinorrhea.     Right Sinus: No maxillary sinus tenderness or frontal sinus tenderness.     Left Sinus: No maxillary sinus tenderness or frontal sinus tenderness.     Mouth/Throat:     Pharynx: Uvula midline.  Eyes:     General: Lids are normal. Lids are everted, no foreign bodies appreciated.     Conjunctiva/sclera: Conjunctivae normal.     Pupils: Pupils are equal, round, and reactive to light.  Neck:     Thyroid: No thyroid mass or thyromegaly.     Vascular: No carotid bruit.     Trachea: Trachea normal.  Cardiovascular:     Rate and Rhythm: Normal rate and regular rhythm.     Pulses: Normal pulses.     Heart sounds: Normal heart sounds, S1 normal and S2 normal. No murmur heard.    No friction rub. No gallop.  Pulmonary:     Effort: Pulmonary effort is normal. No tachypnea or respiratory distress.     Breath sounds: Normal breath sounds. No decreased breath sounds, wheezing, rhonchi or rales.  Abdominal:     General: Bowel sounds are normal.     Palpations: Abdomen is soft.     Tenderness: There is no abdominal tenderness.  Musculoskeletal:     Cervical back: Normal range of motion and neck supple.  Skin:    General: Skin is warm and dry.     Findings: No rash.         Comments: See photo  Neurological:     Mental Status: She is alert.  Psychiatric:        Mood and Affect: Mood is not anxious or depressed.        Speech: Speech normal.        Behavior: Behavior normal. Behavior is cooperative.        Thought Content: Thought content normal.        Judgment: Judgment normal.         Assessment and Plan  Right foot infection Assessment & Plan: Acute, now resolving.  Patient has completed antibiotic and there is no further infection. She  still has healing scab and I encouraged her to keep this covered in dirty environments.  She can keep washing with antibiotic soap and applying Neosporin.  She is cleared to get in the pool.  Return and ER precautions provided.     No follow-ups on file.   Greig Ring, MD

## 2024-05-03 DIAGNOSIS — Z6832 Body mass index (BMI) 32.0-32.9, adult: Secondary | ICD-10-CM | POA: Diagnosis not present

## 2024-05-03 DIAGNOSIS — E66811 Obesity, class 1: Secondary | ICD-10-CM | POA: Diagnosis not present

## 2024-05-03 DIAGNOSIS — R600 Localized edema: Secondary | ICD-10-CM | POA: Diagnosis not present

## 2024-05-03 DIAGNOSIS — F419 Anxiety disorder, unspecified: Secondary | ICD-10-CM | POA: Diagnosis not present

## 2024-05-03 DIAGNOSIS — F900 Attention-deficit hyperactivity disorder, predominantly inattentive type: Secondary | ICD-10-CM | POA: Diagnosis not present

## 2024-05-03 DIAGNOSIS — E782 Mixed hyperlipidemia: Secondary | ICD-10-CM | POA: Diagnosis not present

## 2024-05-03 DIAGNOSIS — E559 Vitamin D deficiency, unspecified: Secondary | ICD-10-CM | POA: Diagnosis not present

## 2024-06-01 DIAGNOSIS — F419 Anxiety disorder, unspecified: Secondary | ICD-10-CM | POA: Diagnosis not present

## 2024-06-01 DIAGNOSIS — F900 Attention-deficit hyperactivity disorder, predominantly inattentive type: Secondary | ICD-10-CM | POA: Diagnosis not present

## 2024-06-01 DIAGNOSIS — Z6832 Body mass index (BMI) 32.0-32.9, adult: Secondary | ICD-10-CM | POA: Diagnosis not present

## 2024-06-01 DIAGNOSIS — E559 Vitamin D deficiency, unspecified: Secondary | ICD-10-CM | POA: Diagnosis not present

## 2024-06-01 DIAGNOSIS — R600 Localized edema: Secondary | ICD-10-CM | POA: Diagnosis not present

## 2024-06-01 DIAGNOSIS — E782 Mixed hyperlipidemia: Secondary | ICD-10-CM | POA: Diagnosis not present

## 2024-06-01 DIAGNOSIS — E66811 Obesity, class 1: Secondary | ICD-10-CM | POA: Diagnosis not present

## 2024-08-09 DIAGNOSIS — F419 Anxiety disorder, unspecified: Secondary | ICD-10-CM | POA: Diagnosis not present

## 2024-08-09 DIAGNOSIS — R600 Localized edema: Secondary | ICD-10-CM | POA: Diagnosis not present

## 2024-08-09 DIAGNOSIS — E66811 Obesity, class 1: Secondary | ICD-10-CM | POA: Diagnosis not present

## 2024-08-09 DIAGNOSIS — J302 Other seasonal allergic rhinitis: Secondary | ICD-10-CM | POA: Diagnosis not present

## 2024-08-09 DIAGNOSIS — E559 Vitamin D deficiency, unspecified: Secondary | ICD-10-CM | POA: Diagnosis not present

## 2024-08-09 DIAGNOSIS — F900 Attention-deficit hyperactivity disorder, predominantly inattentive type: Secondary | ICD-10-CM | POA: Diagnosis not present

## 2024-08-09 DIAGNOSIS — Z6832 Body mass index (BMI) 32.0-32.9, adult: Secondary | ICD-10-CM | POA: Diagnosis not present

## 2024-08-09 DIAGNOSIS — E782 Mixed hyperlipidemia: Secondary | ICD-10-CM | POA: Diagnosis not present

## 2024-10-05 ENCOUNTER — Encounter: Payer: Self-pay | Admitting: Family Medicine

## 2024-10-05 ENCOUNTER — Ambulatory Visit (INDEPENDENT_AMBULATORY_CARE_PROVIDER_SITE_OTHER): Admitting: Family Medicine

## 2024-10-05 VITALS — BP 108/72 | HR 73 | Ht 72.0 in | Wt 258.8 lb

## 2024-10-05 DIAGNOSIS — F419 Anxiety disorder, unspecified: Secondary | ICD-10-CM

## 2024-10-05 DIAGNOSIS — Z6835 Body mass index (BMI) 35.0-35.9, adult: Secondary | ICD-10-CM

## 2024-10-05 DIAGNOSIS — B9689 Other specified bacterial agents as the cause of diseases classified elsewhere: Secondary | ICD-10-CM

## 2024-10-05 DIAGNOSIS — E66812 Obesity, class 2: Secondary | ICD-10-CM

## 2024-10-05 DIAGNOSIS — E559 Vitamin D deficiency, unspecified: Secondary | ICD-10-CM

## 2024-10-05 DIAGNOSIS — R600 Localized edema: Secondary | ICD-10-CM

## 2024-10-05 DIAGNOSIS — J302 Other seasonal allergic rhinitis: Secondary | ICD-10-CM

## 2024-10-05 DIAGNOSIS — E782 Mixed hyperlipidemia: Secondary | ICD-10-CM

## 2024-10-05 DIAGNOSIS — J329 Chronic sinusitis, unspecified: Secondary | ICD-10-CM

## 2024-10-05 DIAGNOSIS — F902 Attention-deficit hyperactivity disorder, combined type: Secondary | ICD-10-CM

## 2024-10-05 DIAGNOSIS — R632 Polyphagia: Secondary | ICD-10-CM

## 2024-10-05 MED ORDER — HYDROCHLOROTHIAZIDE 12.5 MG PO TABS: 12.5000 mg | ORAL_TABLET | Freq: Every day | ORAL | 1 refills | Status: DC

## 2024-10-05 MED ORDER — MONTELUKAST SODIUM 10 MG PO TABS: 10.0000 mg | ORAL_TABLET | Freq: Every day | ORAL | 1 refills | Status: AC

## 2024-10-05 MED ORDER — VITAMIN D (ERGOCALCIFEROL) 1.25 MG (50000 UNIT) PO CAPS: 50000.0000 [IU] | ORAL_CAPSULE | ORAL | 0 refills | Status: AC

## 2024-10-05 MED ORDER — PREDNISONE 5 MG PO TABS: ORAL_TABLET | ORAL | 0 refills | Status: AC

## 2024-10-05 MED ORDER — HYDROXYZINE HCL 25 MG PO TABS: 25.0000 mg | ORAL_TABLET | Freq: Every day | ORAL | 0 refills | Status: DC

## 2024-10-05 MED ORDER — DOXYCYCLINE HYCLATE 100 MG PO TABS: 100.0000 mg | ORAL_TABLET | Freq: Two times a day (BID) | ORAL | 0 refills | Status: AC

## 2024-10-05 MED ORDER — AMPHETAMINE-DEXTROAMPHETAMINE 20 MG PO TABS: 20.0000 mg | ORAL_TABLET | Freq: Every day | ORAL | 0 refills | Status: AC

## 2024-10-05 MED ORDER — ROSUVASTATIN CALCIUM 10 MG PO TABS: 10.0000 mg | ORAL_TABLET | Freq: Every day | ORAL | 1 refills | Status: AC

## 2024-10-05 NOTE — Progress Notes (Signed)
 "   Patient Care Team: Prentiss Frieze, DO as PCP - General (Family Medicine) Ob/Gyn, [facility]   Assessment & Plan:   1. Polyphagia (Primary) Comments: Not at goal. Benefits from [facility] but difficult to afford.  2. Vitamin D  deficiency - Vitamin D , Ergocalciferol , (DRISDOL ) 1.25 MG (50000 UNIT) CAPS capsule; Take 1 capsule (50,000 Units total) by mouth every 7 (seven) days.  Dispense: 12 capsule; Refill: 0  3. Mixed hyperlipidemia - rosuvastatin  (CRESTOR ) 10 MG tablet; Take 1 tablet (10 mg total) by mouth at bedtime.  Dispense: 90 tablet; Refill: 1  4. Seasonal allergies - montelukast  (SINGULAIR ) 10 MG tablet; Take 1 tablet (10 mg total) by mouth at bedtime.  Dispense: 90 tablet; Refill: 1  5. ADHD (attention deficit hyperactivity disorder), combined type Comments: Stable with stimulant. Will continue. - amphetamine -dextroamphetamine  (ADDERALL) 20 MG tablet; Take 1 tablet (20 mg total) by mouth daily.  Dispense: 30 tablet; Refill: 0  6. Sinusitis, bacterial Comments: New. Symptoms x 10 days. Okay treatment below with general precautions. - predniSONE  (DELTASONE ) 5 MG tablet; 6-5-4-3-2-1-off  Dispense: 21 tablet; Refill: 0 - doxycycline  (VIBRA -TABS) 100 MG tablet; Take 1 tablet (100 mg total) by mouth 2 (two) times daily.  Dispense: 20 tablet; Refill: 0  7. Bilateral lower extremity edema - hydrochlorothiazide  (HYDRODIURIL ) 12.5 MG tablet; Take 1 tablet (12.5 mg total) by mouth daily.  Dispense: 90 tablet; Refill: 1  8. Anxiety - hydrOXYzine  (ATARAX ) 25 MG tablet; Take 1 tablet (25 mg total) by mouth daily.  Dispense: 90 tablet; Refill: 0  9. Class 2 severe obesity with serious comorbidity and body mass index (BMI) of 35.0 to 35.9 in adult, unspecified obesity type  Frieze Prentiss, DO, MS, FAAFP, Dipl. [facility] [address] [place], [address] Dept: [phone] Dept Fax: [phone]  Subjective:   Review of Systems:  Negative, with the exception of above mentioned in HPI.  Medications:   Show/hide medication list[1]   Objective:   BP 108/72 (BP Location: Right Arm, Cuff Size: Large)   Pulse 73   Ht 6' (1.829 m)   Wt 258 lb 12.8 oz (117.4 kg)   LMP  (LMP Unknown)   SpO2 98%   BMI 35.10 kg/m   Physical Exam Constitutional:      General: She is not in acute distress.    Appearance: She is well-developed.  HENT:     Head: Normocephalic and atraumatic.  Eyes:     Conjunctiva/sclera: Conjunctivae normal.  Cardiovascular:     Rate and Rhythm: Normal rate and regular rhythm.     Heart sounds: Normal heart sounds.  Pulmonary:     Effort: Pulmonary effort is normal.     Breath sounds: Normal breath sounds.  Neurological:     General: No focal deficit present.     Mental Status: She is alert.  Psychiatric:        Behavior: Behavior normal.    Attestations:   Patient is well-known to me from previous care setting and is establishing care in this system with me as PCP. Chart updated today with reconciliation of problem list, medications, allergies, and relevant history. Preventive care and chronic disease status reviewed.  Outside labs reviewed and will be abstracted. Portions of historical chart may remain incomplete; will update on an ongoing basis as clinically indicated.  As the patient's primary care physician, board-certified in Family Medicine and Obesity Medicine, I am providing ongoing, comprehensive obesity care based on the pillars of  obesity medicine, including nutrition therapy, physical activity, behavioral modification, and pharmacologic treatment.     [1]  Outpatient Medications Prior to Visit  Medication Sig   azelastine  (ASTELIN ) 0.1 % nasal spray Place 1 spray into both nostrils 2 (two) times daily. Use in each nostril as directed   cetirizine  (ZYRTEC  ALLERGY) 10 MG tablet Take 1 tablet (10 mg total) by mouth 2 (two) times daily for 10 days.   EPINEPHrine  0.3 mg/0.3 mL IJ  SOAJ injection Inject 0.3 mg into the muscle as needed for anaphylaxis.   fluticasone  (FLONASE ) 50 MCG/ACT nasal spray Place 2 sprays into both nostrils daily.   Levonorgestrel 13.5 MG IUD Skyla 14 mcg/24 hrs (3 yrs) 13.5 mg intrauterine device  Take 1 device by intrauterine route.   [DISCONTINUED] amphetamine -dextroamphetamine  (ADDERALL) 20 MG tablet Take 20 mg by mouth daily.   [DISCONTINUED] hydrochlorothiazide  (HYDRODIURIL ) 12.5 MG tablet 1 tablet in the morning Orally Once a day   [DISCONTINUED] hydrOXYzine  (ATARAX ) 25 MG tablet Take 25 mg by mouth daily.   [DISCONTINUED] montelukast  (SINGULAIR ) 10 MG tablet TAKE ONE TABLET BY MOUTH ONCE A DAY for 30   [DISCONTINUED] rosuvastatin  (CRESTOR ) 10 MG tablet Take 10 mg by mouth at bedtime.   [DISCONTINUED] Vitamin D , Ergocalciferol , (DRISDOL ) 1.25 MG (50000 UNIT) CAPS capsule Take 1 capsule (50,000 Units total) by mouth every 7 (seven) days.   Semaglutide -Weight Management (WEGOVY ) 0.5 MG/0.5ML SOAJ Inject 0.5 mg into the skin once a week. (Patient not taking: Reported on 10/05/2024)   No facility-administered medications prior to visit.   "

## 2024-10-20 ENCOUNTER — Encounter: Payer: Self-pay | Admitting: Family Medicine

## 2024-11-01 ENCOUNTER — Ambulatory Visit: Admitting: Family Medicine

## 2024-11-01 ENCOUNTER — Encounter: Payer: Self-pay | Admitting: Family Medicine

## 2024-11-01 VITALS — BP 108/66 | HR 82 | Ht 72.0 in | Wt 256.0 lb

## 2024-11-01 DIAGNOSIS — R600 Localized edema: Secondary | ICD-10-CM | POA: Diagnosis not present

## 2024-11-01 DIAGNOSIS — Z6834 Body mass index (BMI) 34.0-34.9, adult: Secondary | ICD-10-CM

## 2024-11-01 DIAGNOSIS — F419 Anxiety disorder, unspecified: Secondary | ICD-10-CM | POA: Diagnosis not present

## 2024-11-01 DIAGNOSIS — E782 Mixed hyperlipidemia: Secondary | ICD-10-CM | POA: Diagnosis not present

## 2024-11-01 DIAGNOSIS — R632 Polyphagia: Secondary | ICD-10-CM | POA: Diagnosis not present

## 2024-11-01 DIAGNOSIS — E66811 Obesity, class 1: Secondary | ICD-10-CM

## 2024-11-01 MED ORDER — HYDROXYZINE HCL 25 MG PO TABS: 25.0000 mg | ORAL_TABLET | Freq: Every day | ORAL | 0 refills | Status: AC

## 2024-11-01 MED ORDER — HYDROCHLOROTHIAZIDE 12.5 MG PO TABS: 12.5000 mg | ORAL_TABLET | Freq: Every day | ORAL | 1 refills | Status: AC

## 2024-11-01 MED ORDER — WEGOVY 0.5 MG/0.5ML ~~LOC~~ SOAJ: 0.5000 mg | SUBCUTANEOUS | 1 refills | Status: AC

## 2024-11-01 NOTE — Progress Notes (Unsigned)
 "    Patient Care Team: Prentiss Frieze, DO as PCP - General (Family Medicine) Ob/Gyn, [facility]  Weight Management:   Starting weight: 246 lbs.  Starting date: 07/05/21.  Current weight: 256 lbs.  Weight change since last encounter: -2 lbs.  Weight change since first encounter: +10lbs.  Total weight loss percentage:+4.07%.  Body Fat Percentage: 40.2%.    RMR: 2160.        Nutrition Plan: Practicing portion control and making smarter food choices, such as increasing vegetables and decreasing simple carbohydrates.        Adherence to Nutrition Plan: good, doing better with portions and eating high protein meals.        Current Anti-Obesity Medication, including off-label: None.        Recent Physical Activity: Cardio 60-90 minutes 3 days per week & walking dogs 3 days per week for 15 minutes.    Patient is under the care of an Obesity Medicine-certified physician providing longitudinal care using a comprehensive, pillar-based obesity treatment model (nutrition therapy, physical activity counseling, behavioral modification, pharmacotherapy when indicated, and medical monitoring), consistent with standards outlined by the Obesity Medicine Association. Obesity is treated as a medical disease, not a cosmetic condition. Despite adherence, weight loss has been inadequate and/or not sustained. Continued lifestyle-only therapy is insufficient given the chronic disease biology of obesity. Initiate anti-obesity pharmacotherapy as adjunct therapy to lifestyle modification, consistent with FDA indication and evidence-based obesity care. Medication choice is supported by evidence demonstrating improvement in weight, cardiometabolic risk, and obesity-related complications.  Medication is prescribed for treatment of chronic obesity disease and associated medical conditions, not for cosmetic weight loss.   Frieze Prentiss, DO, MS (Nutrition), FAAFP, Dipl. ABOM Fellow, American Academy of Family  Physicians Diplomate, American Board of Obesity Medicine [facility] Primary Care at [facility] [address] [place], [place] [address] Dept: [phone] Fax: [phone]  Diagnoses and Orders:   1. Bilateral lower extremity edema   2. Anxiety   3. Class 1 obesity with serious comorbidity and body mass index (BMI) of 34.0 to 34.9 in adult, unspecified obesity type    Meds ordered this encounter  Medications   hydrochlorothiazide  (HYDRODIURIL ) 12.5 MG tablet    Sig: Take 1 tablet (12.5 mg total) by mouth daily.    Dispense:  90 tablet    Refill:  1   hydrOXYzine  (ATARAX ) 25 MG tablet    Sig: Take 1 tablet (25 mg total) by mouth daily.    Dispense:  90 tablet    Refill:  0   semaglutide -weight management (WEGOVY ) 0.5 MG/0.5ML SOAJ SQ injection    Sig: Inject 0.5 mg into the skin once a week.    Dispense:  2 mL    Refill:  1   Assessment & Plan:   Assessment & Plan Obesity Class 2 severe obesity with BMI 35.0-35.9. Weight 256 lbs, muscle mass 145 lbs, 10 lbs increase since 2022. Resting metabolic rate 2170. Discussed rapid weight loss risks, including gallbladder issues. Considered Mounjaro and Wegovy  for weight management. Discussed dietary habits, non-nutritive sweeteners, sleep, exercise, and calorie monitoring. - Prescribed Mounjaro starting at 2.5 mg, increase to 5 mg, 7.5 mg, and 10 mg as tolerated. - Attempted to obtain insurance coverage for Wegovy . - Advised on dietary habits, monitoring calorie intake, and ensuring adequate sleep. - Encouraged regular exercise and use of a calorie tracking app. - Discussed potential use of ursodiol to decrease risk of gallstones with rapid weight loss.  Subjective:  History of Present Illness Misty [name] is a 24 year old female who presents for weight management and medication review.  Weight gain and body composition - Weight increased from 250 lb (2022) to 256 lb (current) - Muscle mass increased  from 134 lb to 145 lb - Increased water weight - Goal weight is 200 lb, ideally 185 lb, by September wedding - Restricts caloric intake to approximately 1200 calories per day - Performs regular 30-minute home workouts - Feels post-exercise fatigue, attributed to normal exertion rather than low mood  Medication use and access to care - Takes hydrochlorothiazide  daily - Drinks large amounts of water - Interested in weight loss medications - Expresses concern about higher copays and deductibles for primary care, specialists, and prescriptions, which may limit access to some treatments - Inquires about safety and appropriateness of medications such as Mounjaro  Sleep patterns - Sleeps approximately 9 hours nightly  Hepatic steatosis and hyperlipidemia - Prior ultrasound demonstrated fatty liver - Asks about the impact of weight loss on hyperlipidemia  Dermatologic symptoms - Very dry, 'sandpaper' skin - Questions possible relationship to current medications or overall health  Review of Systems: Negative, with the exception of above mentioned in HPI.  History:   Reviewed by clinician on day of visit: allergies, medications, problem list, medical history, surgical history, family history, social history, and previous encounter notes.  Medications:   Show/hide medication list[1] Allergies[2]  Objective:   BP 108/66 (BP Location: Right Arm, Cuff Size: Large)   Pulse 82   Ht 6' (1.829 m)   Wt 256 lb (116.1 kg)   LMP  (LMP Unknown)   SpO2 98%   BMI 34.72 kg/m   Physical Exam Constitutional:      General: She is not in acute distress.    Appearance: She is well-developed.  HENT:     Head: Normocephalic and atraumatic.  Eyes:     Conjunctiva/sclera: Conjunctivae normal.  Cardiovascular:     Rate and Rhythm: Normal rate and regular rhythm.     Heart sounds: Normal heart sounds.  Pulmonary:     Effort: Pulmonary effort is normal.     Breath sounds: Normal breath sounds.   Neurological:     General: No focal deficit present.     Mental Status: She is alert.  Psychiatric:        Behavior: Behavior normal.     Attestations:   Reviewed by clinician on day of visit: allergies, medications, problem list, medical history, surgical history, family history, social history, and previous encounter notes. As the patient's primary care physician, board-certified in Family Medicine and Obesity Medicine, I am providing ongoing, comprehensive obesity care based on the pillars of obesity medicine, including nutrition therapy, physical activity, behavioral modification, and pharmacologic treatment. Motivational interviewing as well as evidence-based interventions for health behavior change were utilized today including the discussion of self monitoring techniques, problem-solving barriers and SMART goal setting techniques.       [1]  Outpatient Medications Prior to Visit  Medication Sig   amphetamine -dextroamphetamine  (ADDERALL) 20 MG tablet Take 1 tablet (20 mg total) by mouth daily.   azelastine  (ASTELIN ) 0.1 % nasal spray Place 1 spray into both nostrils 2 (two) times daily. Use in each nostril as directed   cetirizine  (ZYRTEC  ALLERGY) 10 MG tablet Take 1 tablet (10 mg total) by mouth 2 (two) times daily for 10 days.   doxycycline  (VIBRA -TABS) 100 MG tablet Take 1 tablet (100 mg total) by mouth 2 (two) times daily.  EPINEPHrine  0.3 mg/0.3 mL IJ SOAJ injection Inject 0.3 mg into the muscle as needed for anaphylaxis.   fluticasone  (FLONASE ) 50 MCG/ACT nasal spray Place 2 sprays into both nostrils daily.   Levonorgestrel 13.5 MG IUD Skyla 14 mcg/24 hrs (3 yrs) 13.5 mg intrauterine device  Take 1 device by intrauterine route.   montelukast  (SINGULAIR ) 10 MG tablet Take 1 tablet (10 mg total) by mouth at bedtime.   predniSONE  (DELTASONE ) 5 MG tablet 6-5-4-3-2-1-off   rosuvastatin  (CRESTOR ) 10 MG tablet Take 1 tablet (10 mg total) by mouth at bedtime.   Vitamin D ,  Ergocalciferol , (DRISDOL ) 1.25 MG (50000 UNIT) CAPS capsule Take 1 capsule (50,000 Units total) by mouth every 7 (seven) days.   [DISCONTINUED] hydrochlorothiazide  (HYDRODIURIL ) 12.5 MG tablet Take 1 tablet (12.5 mg total) by mouth daily.   [DISCONTINUED] hydrOXYzine  (ATARAX ) 25 MG tablet Take 1 tablet (25 mg total) by mouth daily.   [DISCONTINUED] Semaglutide -Weight Management (WEGOVY ) 0.5 MG/0.5ML SOAJ Inject 0.5 mg into the skin once a week. (Patient not taking: Reported on 11/01/2024)   No facility-administered medications prior to visit.  [2]  Allergies Allergen Reactions   Peanut (Diagnostic) Anaphylaxis    Throat Swells   Amoxicillin -Pot Clavulanate     Vomting. Tolerates amoxicillin    "

## 2024-11-11 ENCOUNTER — Other Ambulatory Visit (HOSPITAL_COMMUNITY): Payer: Self-pay

## 2024-11-15 ENCOUNTER — Other Ambulatory Visit (HOSPITAL_COMMUNITY): Payer: Self-pay

## 2024-11-15 ENCOUNTER — Encounter: Payer: Self-pay | Admitting: Family Medicine

## 2024-11-17 ENCOUNTER — Other Ambulatory Visit (HOSPITAL_COMMUNITY): Payer: Self-pay

## 2024-11-19 NOTE — Progress Notes (Signed)
 "    Patient Care Team: Prentiss Frieze, DO as PCP - General (Family Medicine) Ob/Gyn, [facility]  Weight Management:   Starting weight: 246 lbs.  Starting date: 07/05/21.  Current weight: 256 lbs.  Weight change since last encounter: -2 lbs.  Weight change since first encounter: +10lbs.  Total weight loss percentage:+4.07%.  Body Fat Percentage: 40.2%.    RMR: 2160.  Nutrition Plan: Practicing portion control and making smarter food choices, such as increasing vegetables and decreasing simple carbohydrates.  Adherence to Nutrition Plan: good, doing better with portions and eating high protein meals.  Current Anti-Obesity Medication, including off-label: None.  Recent Physical Activity: Cardio 60-90 minutes 3 days per week & walking dogs 3 days per week for 15 minutes.    Patient is under the care of an Obesity Medicine-certified physician providing longitudinal care using a comprehensive, pillar-based obesity treatment model (nutrition therapy, physical activity counseling, behavioral modification, pharmacotherapy when indicated, and medical monitoring), consistent with standards outlined by the Obesity Medicine Association. Obesity is treated as a medical disease, not a cosmetic condition. Despite adherence, weight loss has been inadequate and/or not sustained. Continued lifestyle-only therapy is insufficient given the chronic disease biology of obesity. Initiate anti-obesity pharmacotherapy as adjunct therapy to lifestyle modification, consistent with FDA indication and evidence-based obesity care. Medication choice is supported by evidence demonstrating improvement in weight, cardiometabolic risk, and obesity-related complications.  Medication is prescribed for treatment of chronic obesity disease and associated medical conditions, not for cosmetic weight loss.   Frieze Prentiss, DO, MS (Nutrition), FAAFP, Dipl. ABOM Fellow, American Academy of Family Physicians Diplomate, American Board  of Obesity Medicine [facility] Primary Care at [facility] [address] [place], [place] [address] Dept: [phone] Fax: [phone]  Diagnoses and Orders:   1. Bilateral lower extremity edema   2. Anxiety   3. Mixed hyperlipidemia   4. Polyphagia   5. Class 1 obesity with serious comorbidity and body mass index (BMI) of 34.0 to 34.9 in adult, unspecified obesity type    Meds ordered this encounter  Medications   hydrochlorothiazide  (HYDRODIURIL ) 12.5 MG tablet    Sig: Take 1 tablet (12.5 mg total) by mouth daily.    Dispense:  90 tablet    Refill:  1   hydrOXYzine  (ATARAX ) 25 MG tablet    Sig: Take 1 tablet (25 mg total) by mouth daily.    Dispense:  90 tablet    Refill:  0   semaglutide -weight management (WEGOVY ) 0.5 MG/0.5ML SOAJ SQ injection    Sig: Inject 0.5 mg into the skin once a week.    Dispense:  2 mL    Refill:  1   Assessment & Plan:   Assessment & Plan Obesity Class 2 severe obesity with BMI 35.0-35.9. Weight 256 lbs, muscle mass 145 lbs, 10 lbs increase since 2022. Resting metabolic rate 2170. Discussed rapid weight loss risks, including gallbladder issues. Considered Mounjaro and Wegovy  for weight management. Discussed dietary habits, non-nutritive sweeteners, sleep, exercise, and calorie monitoring. - Prescribed Mounjaro starting at 2.5 mg, increase to 5 mg, 7.5 mg, and 10 mg as tolerated. - Attempted to obtain insurance coverage for Wegovy . - Advised on dietary habits, monitoring calorie intake, and ensuring adequate sleep. - Encouraged regular exercise and use of a calorie tracking app. - Discussed potential use of ursodiol to decrease risk of gallstones with rapid weight loss.  Subjective:   History of Present Illness Misty [name] is a 24 year old female who presents  for weight management and medication review.  Weight gain and body composition - Weight increased from 250 lb (2022) to 256 lb (current) - Muscle mass  increased from 134 lb to 145 lb - Increased water weight - Goal weight is 200 lb, ideally 185 lb, by September wedding - Restricts caloric intake to approximately 1200 calories per day - Performs regular 30-minute home workouts - Feels post-exercise fatigue, attributed to normal exertion rather than low mood  Medication use and access to care - Takes hydrochlorothiazide  daily - Drinks large amounts of water - Interested in weight loss medications - Expresses concern about higher copays and deductibles for primary care, specialists, and prescriptions, which may limit access to some treatments - Inquires about safety and appropriateness of medications such as Mounjaro  Sleep patterns - Sleeps approximately 9 hours nightly  Hepatic steatosis and hyperlipidemia - Prior ultrasound demonstrated fatty liver - Asks about the impact of weight loss on hyperlipidemia  Dermatologic symptoms - Very dry, 'sandpaper' skin - Questions possible relationship to current medications or overall health  Review of Systems: Negative, with the exception of above mentioned in HPI.  History:   Reviewed by clinician on day of visit: allergies, medications, problem list, medical history, surgical history, family history, social history, and previous encounter notes.  Medications:   Show/hide medication list[1] Allergies[2]  Objective:   BP 108/66 (BP Location: Right Arm, Cuff Size: Large)   Pulse 82   Ht 6' (1.829 m)   Wt 256 lb (116.1 kg)   LMP  (LMP Unknown)   SpO2 98%   BMI 34.72 kg/m   Physical Exam Constitutional:      General: She is not in acute distress.    Appearance: She is well-developed.  HENT:     Head: Normocephalic and atraumatic.  Eyes:     Conjunctiva/sclera: Conjunctivae normal.  Cardiovascular:     Rate and Rhythm: Normal rate and regular rhythm.     Heart sounds: Normal heart sounds.  Pulmonary:     Effort: Pulmonary effort is normal.     Breath sounds: Normal  breath sounds.  Neurological:     General: No focal deficit present.     Mental Status: She is alert.  Psychiatric:        Behavior: Behavior normal.     Attestations:   Reviewed by clinician on day of visit: allergies, medications, problem list, medical history, surgical history, family history, social history, and previous encounter notes. As the patient's primary care physician, board-certified in Family Medicine and Obesity Medicine, I am providing ongoing, comprehensive obesity care based on the pillars of obesity medicine, including nutrition therapy, physical activity, behavioral modification, and pharmacologic treatment. Motivational interviewing as well as evidence-based interventions for health behavior change were utilized today including the discussion of self monitoring techniques, problem-solving barriers and SMART goal setting techniques.        [1]  Outpatient Medications Prior to Visit  Medication Sig   amphetamine -dextroamphetamine  (ADDERALL) 20 MG tablet Take 1 tablet (20 mg total) by mouth daily.   azelastine  (ASTELIN ) 0.1 % nasal spray Place 1 spray into both nostrils 2 (two) times daily. Use in each nostril as directed   cetirizine  (ZYRTEC  ALLERGY) 10 MG tablet Take 1 tablet (10 mg total) by mouth 2 (two) times daily for 10 days.   doxycycline  (VIBRA -TABS) 100 MG tablet Take 1 tablet (100 mg total) by mouth 2 (two) times daily.   EPINEPHrine  0.3 mg/0.3 mL IJ SOAJ injection Inject 0.3 mg into  the muscle as needed for anaphylaxis.   fluticasone  (FLONASE ) 50 MCG/ACT nasal spray Place 2 sprays into both nostrils daily.   Levonorgestrel 13.5 MG IUD Skyla 14 mcg/24 hrs (3 yrs) 13.5 mg intrauterine device  Take 1 device by intrauterine route.   montelukast  (SINGULAIR ) 10 MG tablet Take 1 tablet (10 mg total) by mouth at bedtime.   predniSONE  (DELTASONE ) 5 MG tablet 6-5-4-3-2-1-off   rosuvastatin  (CRESTOR ) 10 MG tablet Take 1 tablet (10 mg total) by mouth at bedtime.    Vitamin D , Ergocalciferol , (DRISDOL ) 1.25 MG (50000 UNIT) CAPS capsule Take 1 capsule (50,000 Units total) by mouth every 7 (seven) days.   [DISCONTINUED] hydrochlorothiazide  (HYDRODIURIL ) 12.5 MG tablet Take 1 tablet (12.5 mg total) by mouth daily.   [DISCONTINUED] hydrOXYzine  (ATARAX ) 25 MG tablet Take 1 tablet (25 mg total) by mouth daily.   [DISCONTINUED] Semaglutide -Weight Management (WEGOVY ) 0.5 MG/0.5ML SOAJ Inject 0.5 mg into the skin once a week. (Patient not taking: Reported on 11/01/2024)   No facility-administered medications prior to visit.  [2]  Allergies Allergen Reactions   Peanut (Diagnostic) Anaphylaxis    Throat Swells   Amoxicillin -Pot Clavulanate     Vomting. Tolerates amoxicillin    "

## 2024-12-02 ENCOUNTER — Ambulatory Visit: Admitting: Family Medicine
